# Patient Record
Sex: Female | Born: 1990
Health system: Southern US, Community
[De-identification: ages and names within clinical notes are randomized; demographics above are authoritative.]

## PROBLEM LIST (undated history)

## (undated) DIAGNOSIS — F32A Depression, unspecified: Secondary | ICD-10-CM

## (undated) DIAGNOSIS — E119 Type 2 diabetes mellitus without complications: Secondary | ICD-10-CM

## (undated) DIAGNOSIS — F329 Major depressive disorder, single episode, unspecified: Secondary | ICD-10-CM

## (undated) HISTORY — DX: Depression, unspecified: F32.A

## (undated) HISTORY — DX: Major depressive disorder, single episode, unspecified: F32.9

---

## 2011-03-08 ENCOUNTER — Emergency Department (HOSPITAL_COMMUNITY)
Admission: EM | Admit: 2011-03-08 | Discharge: 2011-03-08 | Disposition: A | Discharge status: Home / Self Care | Payer: BC Managed Care – PPO | Attending: Emergency Medicine | Admitting: Emergency Medicine

## 2011-03-08 DIAGNOSIS — R58 Hemorrhage, not elsewhere classified: Secondary | ICD-10-CM | POA: Insufficient documentation

## 2012-06-18 ENCOUNTER — Encounter (HOSPITAL_COMMUNITY): Payer: Self-pay | Admitting: *Deleted

## 2012-06-18 ENCOUNTER — Emergency Department (HOSPITAL_COMMUNITY)
Admission: EM | Admit: 2012-06-18 | Discharge: 2012-06-18 | Disposition: A | Discharge status: Home / Self Care | Payer: BC Managed Care – PPO | Source: Home / Self Care

## 2012-06-18 DIAGNOSIS — S63509A Unspecified sprain of unspecified wrist, initial encounter: Secondary | ICD-10-CM

## 2012-06-18 DIAGNOSIS — S66919A Strain of unspecified muscle, fascia and tendon at wrist and hand level, unspecified hand, initial encounter: Secondary | ICD-10-CM

## 2012-06-18 MED ORDER — NAPROXEN 500 MG PO TABS: 500.0000 mg | ORAL_TABLET | Freq: Two times a day (BID) | ORAL | Status: AC

## 2012-06-18 NOTE — ED Provider Notes (Signed)
History     CSN: [id number]  Arrival date & time 06/18/12  1830   None     Chief Complaint  Patient presents with  . Wrist Pain    (Consider location/radiation/quality/duration/timing/severity/associated sxs/prior treatment) HPI Comments: Pt first noticed pain in R wrist about 6 months ago, was very mild, didn't last long, spontaneously resolved.  About 6 weeks ago, began having pain more on and off, worse when at work lifting and opening boxes, but pain was still mild and never lasted very long.  Last night after work pain was much more severe than usual, lasted through the night.  At work today was the worst it's been.  Tonight after work, after resting, it is starting to feel better.  Denies injury.    Patient is a 21 y.o. female presenting with wrist pain. The history is provided by the patient.  Wrist Pain This is a new problem. The current episode started yesterday. The problem occurs constantly. The problem has been gradually improving. The symptoms are aggravated by bending and exertion. The symptoms are relieved by rest. She has tried nothing for the symptoms.    History reviewed. No pertinent past medical history.  History reviewed. No pertinent past surgical history.  History reviewed. No pertinent family history.  History  Substance Use Topics  . Smoking status: Former Games developer  . Smokeless tobacco: Not on file  . Alcohol Use: Yes    OB History    Grav Para Term Preterm Abortions TAB SAB Ect Mult Living                  Review of Systems  Constitutional: Negative for fever and chills.  Musculoskeletal:       R wrist pain.   Skin: Negative for color change, rash and wound.  Neurological: Negative for weakness and numbness.    Allergies  Review of patient's allergies indicates no known allergies.  Home Medications   Current Outpatient Rx  Name Route Sig Dispense Refill  . NAPROXEN 500 MG PO TABS Oral Take 1 tablet (500 mg total) by mouth 2 (two) times  daily. 20 tablet 0    BP 123/77  Pulse 73  Temp 98.2 F (36.8 C) (Oral)  Resp 17  SpO2 98%  LMP 06/12/2012  Physical Exam  Constitutional: She is oriented to person, place, and time. She appears well-developed and well-nourished. No distress.  Pulmonary/Chest: Effort normal.  Musculoskeletal:       Right wrist: She exhibits tenderness. She exhibits normal range of motion, no bony tenderness, no swelling and no deformity.       Left wrist: Normal.       Right hand: Normal.       Left hand: Normal.  Neurological: She is alert and oriented to person, place, and time. She has normal strength. No sensory deficit.       Strength and sensation in B wrist/hand normal    ED Course  Procedures (including critical care time)  Labs Reviewed - No data to display No results found.   1. Wrist strain       MDM          Cathlyn Parsons, NP 06/18/12 2019

## 2012-06-18 NOTE — ED Provider Notes (Signed)
Medical screening examination/treatment/procedure(s) were performed by non-physician practitioner and as supervising physician I was immediately available for consultation/collaboration.  Leslee Home, M.D.   Reuben Likes, MD 06/18/12 [phone]

## 2012-06-18 NOTE — ED Notes (Signed)
Pt with intermittent pain right wrist x 6 weeks - this episode onset last night - no known injury - worse with movement when making a fist

## 2012-06-18 NOTE — Discharge Instructions (Signed)
Use the wrist splint for support, especially when at work.  Take the pain medicine twice a day for a week, then as needed for pain.  Ice your wrist after working or when it's very painful.  If your wrist is not getting better after 1-2 weeks of this treatment, make an appointment to see Dr. Izora Ribas (the hand/wrist specialist).    Sprain A sprain happens when the bands of tissue that connect bones and hold joints together (ligaments) stretch too much or tear. HOME CARE  Raise (elevate) the injured area to lessen puffiness (swelling).   Put ice on the injured area.   Put ice in a plastic bag.   Place a towel between your skin and the bag.   Leave the ice on for 15 to 20 minutes, 3 to 4 times a day.   Do this for the first 24 hours or as told by your doctor.   Wear any splints, braces, castings, or elastic wraps as told by your child's doctor.   Eat healthy foods.   Only take medicine as told by your doctor.  GET HELP RIGHT AWAY IF:   There is numbness or tingling in the injured limb.   The toes or fingers become blue or white in the injured limb.   The sprained limb is cold to the touch.   There is a sharp, shooting pain in the injured limb.   The puffiness is getting worse instead of better.  MAKE SURE YOU:   Understand these instructions.   Will watch this condition.   Will get help right away if you are not doing well or get worse.  Document Released: 05/24/2008 Document Revised: 11/25/2011 Document Reviewed: 10/22/2009 [facility] Patient Information 2012 [place], [place].

## 2012-10-13 ENCOUNTER — Emergency Department (HOSPITAL_COMMUNITY)
Admission: EM | Admit: 2012-10-13 | Discharge: 2012-10-13 | Disposition: A | Discharge status: Home / Self Care | Payer: BC Managed Care – PPO | Source: Home / Self Care | Attending: Emergency Medicine | Admitting: Emergency Medicine

## 2012-10-13 ENCOUNTER — Encounter (HOSPITAL_COMMUNITY): Payer: Self-pay | Admitting: Emergency Medicine

## 2012-10-13 DIAGNOSIS — IMO0002 Reserved for concepts with insufficient information to code with codable children: Secondary | ICD-10-CM

## 2012-10-13 DIAGNOSIS — J069 Acute upper respiratory infection, unspecified: Secondary | ICD-10-CM

## 2012-10-13 MED ORDER — TRAMADOL HCL 50 MG PO TABS: 100.0000 mg | ORAL_TABLET | Freq: Three times a day (TID) | ORAL | Status: DC | PRN

## 2012-10-13 MED ORDER — CEPHALEXIN 500 MG PO CAPS: 500.0000 mg | ORAL_CAPSULE | Freq: Three times a day (TID) | ORAL | Status: DC

## 2012-10-13 NOTE — ED Provider Notes (Signed)
Chief Complaint  Patient presents with  . Hand Pain    History of Present Illness:   The patient is a 21 year old female who presents today with a two-day history of right middle finger pain and also a one-week history of bilateral ear pain, sore throat, and nasal congestion.  About a week ago the patient broke the nail of her right middle finger. It's been a little bit painful since then but over the past 2-3 days she's had some pain and swelling over the radial nail fold of the right middle finger. There is no visible collection of pus either in the nail fold or underneath the nail, but it is tender to touch. She is able to move all digits well and denies any numbness or tingling.  Also for the past week he's had nasal congestion, clear rhinorrhea, and sore throat. She has had pain in both of her ears and itching. There's been no drainage. She denies fever, chills, or cough.  Review of Systems:  Other than noted above, the patient denies any of the following symptoms: Systemic:  No fevers, chills, sweats, or aches.  No fatigue or tiredness. Musculoskeletal:  No joint pain, arthritis, bursitis, swelling, back pain, or neck pain. Neurological:  No muscular weakness, paresthesias, headache, or trouble with speech or coordination.  No dizziness.  PMFSH:  Past medical history, family history, social history, meds, and allergies were reviewed.  Physical Exam:   Vital signs:  BP 114/73  Pulse 73  Temp 98.1 F (36.7 C) (Oral)  SpO2 100%  LMP 09/21/2012 Gen:  Alert and oriented times 3.  In no distress. ENT: TMs and canals were normal except for a little bit of cerumen in the left ear canal. Nasal mucosa was mildly congested. The pharynx was clear. There was no cervical adenopathy. Musculoskeletal: There is tenderness to palpation over the radial nail fold the right middle finger. There is no visible swelling, erythema, or collection of pus in the nail fold underneath the nail. Otherwise, all  joints had a full a ROM with no swelling, bruising or deformity.  No edema, pulses full. Extremities were warm and pink.  Capillary refill was brisk.  Skin:  Clear, warm and dry.  No rash. Neuro:  Alert and oriented times 3.  Muscle strength was normal.  Sensation was intact to light touch.   Radiology:  No results found. I reviewed the images independently and personally and concur with the radiologist's findings.  Assessment:  The primary encounter diagnosis was Paronychia. A diagnosis of Viral upper respiratory infection was also pertinent to this visit.  Plan:   1.  The following meds were prescribed:   New Prescriptions   CEPHALEXIN (KEFLEX) 500 MG CAPSULE    Take 1 capsule (500 mg total) by mouth 3 (three) times daily.   TRAMADOL (ULTRAM) 50 MG TABLET    Take 2 tablets (100 mg total) by mouth every 8 (eight) hours as needed for pain.   2.  The patient was instructed in symptomatic care, including rest and activity, elevation, application of heat.  Appropriate handouts were given. 3.  The patient was told to return if becoming worse in any way, if no better in 2 or 3 days, and given some red flag symptoms that would indicate earlier return.   4.  The patient was told to follow up in 2 or 3 days if no better.   Reuben Likes, MD 10/13/12 [phone]

## 2012-10-13 NOTE — ED Notes (Signed)
Pt c/o right middle finger pain... Says she had chipped her nail about 2 weeks ago and believes it maybe an ingrown nail... Sx include: tender to touch, swelling, nausea, diarrhea... Denies: fever, vomiting... Also c/o bilateral ear pain.... Pt is alert w/no signs of distress.

## 2014-07-28 ENCOUNTER — Emergency Department (HOSPITAL_COMMUNITY)
Admission: EM | Admit: 2014-07-28 | Discharge: 2014-07-29 | Disposition: A | Discharge status: Home / Self Care | Payer: BC Managed Care – PPO | Attending: Emergency Medicine | Admitting: Emergency Medicine

## 2014-07-28 ENCOUNTER — Encounter (HOSPITAL_COMMUNITY): Payer: Self-pay | Admitting: Emergency Medicine

## 2014-07-28 DIAGNOSIS — B353 Tinea pedis: Secondary | ICD-10-CM

## 2014-07-28 DIAGNOSIS — L988 Other specified disorders of the skin and subcutaneous tissue: Secondary | ICD-10-CM | POA: Insufficient documentation

## 2014-07-28 DIAGNOSIS — Z792 Long term (current) use of antibiotics: Secondary | ICD-10-CM | POA: Insufficient documentation

## 2014-07-28 DIAGNOSIS — Z87891 Personal history of nicotine dependence: Secondary | ICD-10-CM | POA: Insufficient documentation

## 2014-07-28 NOTE — ED Notes (Signed)
Pt. reports itchy/painful blisters between left toes onset 3 days ago . Ambulatory .

## 2014-07-29 ENCOUNTER — Encounter (HOSPITAL_BASED_OUTPATIENT_CLINIC_OR_DEPARTMENT_OTHER): Payer: Self-pay | Admitting: Emergency Medicine

## 2014-07-29 ENCOUNTER — Emergency Department (HOSPITAL_BASED_OUTPATIENT_CLINIC_OR_DEPARTMENT_OTHER)
Admission: EM | Admit: 2014-07-29 | Discharge: 2014-07-29 | Disposition: A | Discharge status: Home / Self Care | Payer: BC Managed Care – PPO | Attending: Emergency Medicine | Admitting: Emergency Medicine

## 2014-07-29 DIAGNOSIS — R229 Localized swelling, mass and lump, unspecified: Secondary | ICD-10-CM | POA: Insufficient documentation

## 2014-07-29 DIAGNOSIS — R21 Rash and other nonspecific skin eruption: Secondary | ICD-10-CM

## 2014-07-29 DIAGNOSIS — Z87891 Personal history of nicotine dependence: Secondary | ICD-10-CM | POA: Insufficient documentation

## 2014-07-29 DIAGNOSIS — B353 Tinea pedis: Secondary | ICD-10-CM | POA: Insufficient documentation

## 2014-07-29 MED ORDER — IBUPROFEN 600 MG PO TABS: 600.0000 mg | ORAL_TABLET | Freq: Four times a day (QID) | ORAL | Status: DC | PRN

## 2014-07-29 MED ORDER — CEPHALEXIN 500 MG PO CAPS: 500.0000 mg | ORAL_CAPSULE | Freq: Four times a day (QID) | ORAL | Status: DC

## 2014-07-29 MED ORDER — CLOTRIMAZOLE 1 % EX CREA: TOPICAL_CREAM | CUTANEOUS | Status: DC

## 2014-07-29 NOTE — Discharge Instructions (Signed)

## 2014-07-29 NOTE — Discharge Instructions (Signed)
Please call your doctor for a followup appointment within 24-48 hours. When you talk to your doctor please let them know that you were seen in the emergency department and have them acquire all of your records so that they can discuss the findings with you and formulate a treatment plan to fully care for your new and ongoing problems. Please call and set-up an appointment with Dermatologist and Foot physician Please rest and stay hydrated  Please take medications as prescribed and on a full stomach  Please continue to monitor symptoms closely and if symptoms are to worsen or change (fever greater than 101, chills, sweating, nausea, vomiting, chest pain, shortness of breath, difficulty breathing, red streaks, swelling, numbness, tingling, drainage from the lesions, bleeding, pus drainage, injury) please report back to the ED immediately   Athlete's Foot Athlete's foot (tinea pedis) is a fungal infection of the skin on the feet. It often occurs on the skin between the toes or underneath the toes. It can also occur on the soles of the feet. Athlete's foot is more likely to occur in hot, humid weather. Not washing your feet or changing your socks often enough can contribute to athlete's foot. The infection can spread from person to person (contagious). CAUSES Athlete's foot is caused by a fungus. This fungus thrives in warm, moist places. Most people get athlete's foot by sharing shower stalls, towels, and wet floors with an infected person. People with weakened immune systems, including those with diabetes, may be more likely to get athlete's foot. SYMPTOMS   Itchy areas between the toes or on the soles of the feet.  White, flaky, or scaly areas between the toes or on the soles of the feet.  Tiny, intensely itchy blisters between the toes or on the soles of the feet.  Tiny cuts on the skin. These cuts can develop a bacterial infection.  Thick or discolored toenails. DIAGNOSIS  Your caregiver can  usually tell what the problem is by doing a physical exam. Your caregiver may also take a skin sample from the rash area. The skin sample may be examined under a microscope, or it may be tested to see if fungus will grow in the sample. A sample may also be taken from your toenail for testing. TREATMENT  Over-the-counter and prescription medicines can be used to kill the fungus. These medicines are available as powders or creams. Your caregiver can suggest medicines for you. Fungal infections respond slowly to treatment. You may need to continue using your medicine for several weeks. PREVENTION   Do not share towels.  Wear sandals in wet areas, such as shared locker rooms and shared showers.  Keep your feet dry. Wear shoes that allow air to circulate. Wear cotton or wool socks. HOME CARE INSTRUCTIONS   Take medicines as directed by your caregiver. Do not use steroid creams on athlete's foot.  Keep your feet clean and cool. Wash your feet daily and dry them thoroughly, especially between your toes.  Change your socks every day. Wear cotton or wool socks. In hot climates, you may need to change your socks 2 to 3 times per day.  Wear sandals or canvas tennis shoes with good air circulation.  If you have blisters, soak your feet in Burow's solution or Epsom salts for 20 to 30 minutes, 2 times a day to dry out the blisters. Make sure you dry your feet thoroughly afterward. SEEK MEDICAL CARE IF:   You have a fever.  You have swelling, soreness,  warmth, or redness in your foot.  You are not getting better after 7 days of treatment.  You are not completely cured after 30 days.  You have any problems caused by your medicines. MAKE SURE YOU:   Understand these instructions.  Will watch your condition.  Will get help right away if you are not doing well or get worse. Document Released: 12/03/2000 Document Revised: 02/28/2012 Document Reviewed: 09/24/2011 [facility] Patient Information  2015 [place], [place]. This information is not intended to replace advice given to you by your health care provider. Make sure you discuss any questions you have with your health care provider.  Cellulitis Cellulitis is an infection of the skin and the tissue beneath it. The infected area is usually red and tender. Cellulitis occurs most often in the arms and lower legs.  CAUSES  Cellulitis is caused by bacteria that enter the skin through cracks or cuts in the skin. The most common types of bacteria that cause cellulitis are staphylococci and streptococci. SIGNS AND SYMPTOMS   Redness and warmth.  Swelling.  Tenderness or pain.  Fever. DIAGNOSIS  Your health care provider can usually determine what is wrong based on a physical exam. Blood tests may also be done. TREATMENT  Treatment usually involves taking an antibiotic medicine. HOME CARE INSTRUCTIONS   Take your antibiotic medicine as directed by your health care provider. Finish the antibiotic even if you start to feel better.  Keep the infected arm or leg elevated to reduce swelling.  Apply a warm cloth to the affected area up to 4 times per day to relieve pain.  Take medicines only as directed by your health care provider.  Keep all follow-up visits as directed by your health care provider. SEEK MEDICAL CARE IF:   You notice red streaks coming from the infected area.  Your red area gets larger or turns dark in color.  Your bone or joint underneath the infected area becomes painful after the skin has healed.  Your infection returns in the same area or another area.  You notice a swollen bump in the infected area.  You develop new symptoms.  You have a fever. SEEK IMMEDIATE MEDICAL CARE IF:   You feel very sleepy.  You develop vomiting or diarrhea.  You have a general ill feeling (malaise) with muscle aches and pains. MAKE SURE YOU:   Understand these instructions.  Will watch your condition.  Will get help  right away if you are not doing well or get worse. Document Released: 09/15/2005 Document Revised: 04/22/2014 Document Reviewed: 02/21/2012 [facility] Patient Information 2015 [place], [place]. This information is not intended to replace advice given to you by your health care provider. Make sure you discuss any questions you have with your health care provider.   Emergency Department Resource Guide 1) Find a Doctor and Pay Out of Pocket Although you won't have to find out who is covered by your insurance plan, it is a good idea to ask around and get recommendations. You will then need to call the office and see if the doctor you have chosen will accept you as a new patient and what types of options they offer for patients who are self-pay. Some doctors offer discounts or will set up payment plans for their patients who do not have insurance, but you will need to ask so you aren't surprised when you get to your appointment.  2) Contact Your Local Health Department Not all health departments have doctors that can see patients  for sick visits, but many do, so it is worth a call to see if yours does. If you don't know where your local health department is, you can check in your phone book. The CDC also has a tool to help you locate your state's health department, and many state websites also have listings of all of their local health departments.  3) Find a Walk-in Clinic If your illness is not likely to be very severe or complicated, you may want to try a walk in clinic. These are popping up all over the country in pharmacies, drugstores, and shopping centers. They're usually staffed by nurse practitioners or physician assistants that have been trained to treat common illnesses and complaints. They're usually fairly quick and inexpensive. However, if you have serious medical issues or chronic medical problems, these are probably not your best option.  No Primary Care Doctor: - Call Health Connect at   [phone] - they can help you locate a primary care doctor that  accepts your insurance, provides certain services, etc. - Physician Referral Service- [phone]  Chronic Pain Problems: Organization         Address  Phone   Notes  [facility]  [phone] Patients need to be referred by their primary care doctor.   Medication Assistance: Organization         Address  Phone   Notes  [facility] Medication [facility] [address] [place], [place] [address] [phone] --Must be a resident of [facility] -- Must have NO insurance coverage whatsoever (no Medicaid/ Medicare, etc.) -- The pt. MUST have a primary care doctor that directs their care regularly and follows them in the community   [facility]ist  [phone]   [employer]  [phone]    Agencies that provide inexpensive medical care: Organization         Address  Phone   Notes  [facility]  [phone]   [facility]    [phone]   [facility] [address] [place], [place] [address] [phone]   [facility] [address] [place] [place] [phone]   Planned Parenthood    [phone]   [facility]    [phone]   Community [facility] and [facility]  [address], [place] Phone:  [phone], Fax:  [phone] Hours of Operation:  9 am - 6 pm, M-F.  Also accepts Medicaid/Medicare and self-pay.  [facility]  [address], [place] Phone: [phone], Fax: [phone]. Hours of Operation:  8:30 am - 5:30 pm, M-F.  Also accepts Medicaid and self-pay.  [facility] [place] [address] [place] Phone: [phone]   [facility] [address] Natasha Bence [place], [place] [phone], Ext. [phone] Mondays & Thursdays: 7-9 AM.  First 15 patients are seen on a first come, first serve basis.     Medicaid-accepting [facility] Providers:  Organization         Address  Phone   Notes  [facility] [address], [place] [phone] Also accepts self-pay patients.  [facility] [address] Laurell Josephs [place], [place]  [phone]   [facility] [address], [place] [phone]   [facility] [address], [place] [phone])  [phone]   Renaye Rakers [address], [place]   [phone] Only accepts [place] patients after they have their name applied to their card.   Self-Pay (no insurance) in [facility]:  Organization         Address  Phone   Notes  Sickle Cell Patients, [facility] [address] [place], [place] [phone]   [facility] [address] [place], [place] [phone]   [facility] [place]  [address], [place] [phone]   [facility]/Dr. Osei-Bonsu  [address], [place] or [address], [place] [phone] Phone number for both [place] and [place] locations is the same.  [facility]gent Medical and [facility] [address], [place] [phone]   [facility] [address] [place] or [address] [phone]   [facility] [address], [place] [phone], phone; [phone], fax Sees patients 1st and 3rd Saturday of every month.  Must not qualify for public or private insurance (i.e. Medicaid, Medicare, [place] Health Choice, Veterans' Benefits)  Household income should be no more than 200% of the poverty level The clinic cannot treat you if you are pregnant or think you are pregnant  Sexually transmitted diseases are not treated at the clinic.    Dental Care: Organization         Address  Phone  Notes  [facility]  Department of [facility] [address] [place], [place] [phone] Accepts children up to age 70 who are enrolled in [place] or [place] Health Choice; pregnant women with a Medicaid card; and children who have applied for Medicaid or [place] Health Choice, but were declined, whose parents can pay a reduced fee at time of service.  [facility] Department of [facility]  [address], [place] [phone] Accepts children up to age 13 who are enrolled in [place] or [place] Health Choice; pregnant women with a Medicaid card; and children who have applied for Medicaid or [place] Health Choice, but were declined, whose parents can pay a reduced fee at time of service.  [facility]  [address] [place], [place] [phone] Patients are seen by appointment only. Walk-ins are not accepted. [facility] will see patients 57 years of age and older. Monday - Tuesday (8am-5pm) Most Wednesdays (8:30-5pm) $30 per visit, cash only  [facility] Adult Dental Access [facility]  [address], [facility] [phone] Patients are seen by appointment only. Walk-ins are not accepted. [facility] will see patients 20 years of age and older. One Wednesday Evening (Monthly: Volunteer Based).  $30 per visit, cash only  [employer]  [phone] for adults; Children under age 7, call Graduate Pediatric Dentistry at [phone]. Children aged 85-14, please call [phone] to request a pediatric application.  Dental services are provided in all areas of dental care including fillings, crowns and bridges, complete and partial dentures, implants, gum treatment, root canals, and extractions. Preventive care is also provided. Treatment is provided to both adults and children. Patients are selected via a lottery and there is often a waiting list.   [facility] [address] [place]  [phone]  [email or web address]   [facility] [address], [place],  [place] [phone], Ext. [phone] Second and Fourth Thursday of each month, opens at 6:30 AM; Clinic ends at 9 AM.  Patients are seen on a first-come first-served basis, and a limited number are seen during each clinic.   [facility]  [address] Ether [place], [place] [phone]   Eligibility Requirements You must have lived in [place], [place], or [place] counties for at least the last three months.   You cannot be eligible for state or federal sponsored [place], including [employer], [place], or [employer].   You generally cannot be eligible for healthcare insurance through your employer.    How to apply: Eligibility screenings are held every Tuesday and Wednesday afternoon from 1:00 pm until 4:00 pm. You do not need an appointment for the interview!  [facility] [address], [place], [place] [phone]   [facility] Health Department  [phone]   [facility] Health Department  [phone]   [facility] Health Department  [phone]    Behavioral Health Resources in the Community: Intensive Outpatient Programs Organization         Address  Phone  Notes  [facility] Services [address], [place], [place] [phone]   [facility] [address], [place], [place] [phone]   [facility] [address], [place], [place]  [phone]   [facility] Mental Health [address]  [place], [place] [phone] or [phone]   Substance Abuse Resources Organization         Address  Phone  Notes  [facility] Drug Services  [phone]   [facility]  [phone]   [facility] [place]  [phone]   Floydene Flock  [phone]   Residential & Outpatient Substance Abuse Program  [phone]   Psychological Services Organization          Address  Phone  Notes  [facility] Behavioral Health  [phone]   [facility]  [phone]   [facility] Mental Health [address], [place] [phone] or [phone]    Mobile Crisis Teams Organization         Address  Phone  Notes  [facility], Mobile Crisis Care Unit  [phone]   [facility]  [address] [place], [place] [phone]   Doristine Locks [address] [place] [phone]    Self-Help/Support Groups Organization         Address  Phone             Notes  [facility] [place] - variety of support groups  336- I7437963 Call for more information  Narcotics Anonymous (NA), [facility] [address], [employer] [place]  2 meetings at this location   Statistician         Address  Phone  Notes  [facility] [address],    [place]  [phone]   [facility]  [address], [place] [address], [place], [place] [phone]   [facility] [address] [place], [place] [phone] Admissions: 8am-3pm M-F  [facility] [address]    [place], [place] [phone]   [facility] [address] Starling Manns [place], [place] [phone]   [facility] [address],  [place], [place] [phone]   [facility] - Intensive Outpatient [address] Laurell Joseph[address], [place], [place] [phone]   [facility] ([facility]iction  [facility] [address]  [place], [place] [phone] or [phone]   [facility] ([facility]) [address] [place], [place] [phone] Accepts Medicaid  [facility] [address]  [place] [phone] Substance Abuse/Addiction Treatment   [facility] Resources Organization         Address  Phone  Notes  [facility]  [phone]   Angie FavaJulie Brannon, PhD [address] [place], [place]   [phone]) [phone] or [phone]) [phone]   [facility]   [address] [place], [place] [phone](336) [phone]   [facility] [address], [place], [place] [phone](336) [phone] Insurance/Medicaid/sponsorship through [facility]  [facility] [address]                                    [place], [place] [phone](336) [phone] Therapy/tele-psych/case  [facility] [address]   [place], [place] [phone]) [phone]    Dr. Lolly MustacheArfeen  [phone](336) [phone]   [facility]  [employer] [facility] 1) [address],  2) [address], [place] 3)  [address], [place] [phone]) [phone]  [phone](336) [phone]   [facility] Child Abuse Hotline [phone]) [phone] or [phone]) [phone] (After Hours)

## 2014-07-29 NOTE — ED Provider Notes (Signed)
Medical screening examination/treatment/procedure(s) were performed by non-physician practitioner and as supervising physician I was immediately available for consultation/collaboration.   EKG Interpretation None        Lilyauna Miedema, MD 07/29/14 0620 

## 2014-07-29 NOTE — ED Provider Notes (Signed)
Medical screening examination/treatment/procedure(s) were conducted as a shared visit with non-physician practitioner(s) and myself.  I personally evaluated the patient during the encounter.  Left foot between 2/3rd toes c/w tinea was itchy and with mild redness/maceration no pain; left foot btn 1/2nd toes with two 1cm vessicles, tender, mild erythema, possible localized cellultis   [name], MD 08/06/14 1300

## 2014-07-29 NOTE — ED Provider Notes (Signed)
CSN: [id number]     Arrival date & time 07/28/14  2330 History   First MD Initiated Contact with Patient 07/28/14 2356     Chief Complaint  Patient presents with  . Blister     (Consider location/radiation/quality/duration/timing/severity/associated sxs/prior Treatment) HPI Comments: Patient presents emergency department with chief complaint of blisters on toes. She states the symptoms started about 3 days ago. She reports associated itching. She has not tried using anything to alleviate her symptoms. There are no aggravating or alleviating factors. She denies any exposures to athlete's foot. Denies history of same.  The history is provided by the patient. No language interpreter was used.    History reviewed. No pertinent past medical history. History reviewed. No pertinent past surgical history. No family history on file. History  Substance Use Topics  . Smoking status: Former Games developermoker  . Smokeless tobacco: Not on file  . Alcohol Use: Yes   OB History   Grav Para Term Preterm Abortions TAB SAB Ect Mult Living                 Review of Systems  Constitutional: Negative for fever and chills.  Respiratory: Negative for shortness of breath.   Cardiovascular: Negative for chest pain.  Gastrointestinal: Negative for nausea, vomiting, diarrhea and constipation.  Genitourinary: Negative for dysuria.  Skin: Positive for rash.      Allergies  Review of patient's allergies indicates no known allergies.  Home Medications   Prior to Admission medications   Medication Sig Start Date End Date Taking? Authorizing Provider  cephALEXin (KEFLEX) 500 MG capsule Take 1 capsule (500 mg total) by mouth 3 (three) times daily. 10/13/12   Reuben Likesavid C Keller, MD  traMADol (ULTRAM) 50 MG tablet Take 2 tablets (100 mg total) by mouth every 8 (eight) hours as needed for pain. 10/13/12   Reuben Likesavid C Keller, MD   BP 122/70  Pulse 97  Temp(Src) 98.3 F (36.8 C) (Oral)  Resp 18  SpO2 98% Physical Exam   Nursing note and vitals reviewed. Constitutional: She is oriented to person, place, and time. She appears well-developed and well-nourished.  HENT:  Head: Normocephalic and atraumatic.  Eyes: Conjunctivae and EOM are normal.  Neck: Normal range of motion.  Cardiovascular: Normal rate.   Pulmonary/Chest: Effort normal.  Abdominal: She exhibits no distension.  Musculoskeletal: Normal range of motion.  Neurological: She is alert and oriented to person, place, and time.  Skin: Skin is dry.  Tinea pedis of bilateral toes  Psychiatric: She has a normal mood and affect. Her behavior is normal. Judgment and thought content normal.    ED Course  Procedures (including critical care time) Labs Review Labs Reviewed - No data to display  Imaging Review No results found.   EKG Interpretation None      MDM   Final diagnoses:  Tinea pedis of both feet    Patient with athlete's foot. Will treat with Lotrimin. Discharged home.    Roxy Horsemanobert Kailly Richoux, PA-C 07/29/14 [phone]

## 2014-07-29 NOTE — ED Provider Notes (Signed)
CSN: [id number]     Arrival date & time 07/29/14  1318 History   First MD Initiated Contact with Patient 07/29/14 1402     No chief complaint on file.    (Consider location/radiation/quality/duration/timing/severity/associated sxs/prior Treatment) The history is provided by the patient. No language interpreter was used.  Alexandra Harris is a 23 y/o F with no known significant PMHx presenting to the ED with rash that has been ongoing since Thursday. Patient reported that on Thursday night she noticed small bumps to the toes of her feet. Stated that the first lesion she saw was on her left second digit. Reported that she noticed that the lesion appeared to be a blister and hurt upon palpation. Reported that on Friday she went skating and after skating she reported that the lesions were pruritic and painful. Stated that the pain got worse, especially when applying pressure to the feet. Reported that the pain to be a sharp pain. Stated that she was seen in the ED last night regarding this issue and was diagnosed with Athlete's Foot and stated that she was given Lotrimin. Patient reported that the cream has alleviated the pruritis, but continues to report pain. Stated that she was at a friend's house and was bit by bed bug-reported that a couple days later this is when she noticed the rash to her feet. Denied being at the beach or walking barefoot recently. Denied changes to skin colored, fever, chills, loss of sensation, new sneakers/lash detergents/soaps, throat closing sensation, chest pain, shortness of breath, difficulty breathing. PCP none  History reviewed. No pertinent past medical history. History reviewed. No pertinent past surgical history. History reviewed. No pertinent family history. History  Substance Use Topics  . Smoking status: Former Research scientist (life sciences)  . Smokeless tobacco: Not on file  . Alcohol Use: Yes   OB History   Grav Para Term Preterm Abortions TAB SAB Ect Mult Living                  Review of Systems  Constitutional: Negative for fever and chills.  Skin: Positive for rash.  Neurological: Negative for weakness and numbness.      Allergies  Review of patient's allergies indicates no known allergies.  Home Medications   Prior to Admission medications   Medication Sig Start Date End Date Taking? Authorizing Provider  cephALEXin (KEFLEX) 500 MG capsule Take 1 capsule (500 mg total) by mouth 3 (three) times daily. 10/13/12   Harden Mo, MD  cephALEXin (KEFLEX) 500 MG capsule Take 1 capsule (500 mg total) by mouth 4 (four) times daily. 07/29/14   Kaniah Rizzolo, PA-C  clotrimazole (LOTRIMIN) 1 % cream Apply between clean and dry toes in the morning and night for 14 days. 07/29/14   Montine Circle, PA-C  ibuprofen (ADVIL,MOTRIN) 600 MG tablet Take 1 tablet (600 mg total) by mouth every 6 (six) hours as needed. 07/29/14   Luellen Howson, PA-C  traMADol (ULTRAM) 50 MG tablet Take 2 tablets (100 mg total) by mouth every 8 (eight) hours as needed for pain. 10/13/12   Harden Mo, MD   BP 133/86  Pulse 88  Temp(Src) 98.7 F (37.1 C) (Oral)  Resp 16  Ht 5' (1.524 m)  Wt 180 lb (81.647 kg)  BMI 35.15 kg/m2  SpO2 100% Physical Exam  Nursing note and vitals reviewed. Constitutional: She is oriented to person, place, and time. She appears well-developed and well-nourished. No distress.  HENT:  Head: Normocephalic and atraumatic.  Mouth/Throat: Oropharynx is  clear and moist. No oropharyngeal exudate.  Negative lesions or sores identified to the oral mucosa  Eyes: Conjunctivae and EOM are normal. Pupils are equal, round, and reactive to light. Right eye exhibits no discharge. Left eye exhibits no discharge.  Neck: Normal range of motion. Neck supple. No tracheal deviation present.  Negative neck stiffness Negative rigidity Negative cervical lymphadenopathy  Cardiovascular: Normal rate, regular rhythm and normal heart sounds.  Exam reveals no friction rub.   No  murmur heard. Pulmonary/Chest: Effort normal and breath sounds normal. No respiratory distress. She has no wheezes. She has no rales.  Musculoskeletal: Normal range of motion.  Full ROM to upper and lower extremities without difficulty noted, negative ataxia noted.  Lymphadenopathy:    She has no cervical adenopathy.  Neurological: She is alert and oriented to person, place, and time. No cranial nerve deficit. She exhibits normal muscle tone. Coordination normal.  Skin: Skin is warm and dry. Rash noted. She is not diaphoretic. No erythema.  Calcified skin with clear fluid collection identified to the digits of the left and right foot. Discomfort upon palpation. Negative findings of ulcerations. Negative active drainage or bleeding. Negative erythema. Negative erythematous base. Negative macerated tissue. Negative flaking of skin. Negative red streaks.   Negative lesions identified to the palms of the hands or soles of the feet.  Psychiatric: She has a normal mood and affect. Her behavior is normal. Thought content normal.    ED Course  Procedures (including critical care time)  4:01 PM Patient seen and assessed by attending physician, Dr. Rebeca Alert. As per physician, reported that there could be possible beginnings of a cellulitis. Recommended patient to continue Lotrimin for athlete's foot and to start patient on a Keflex.   Labs Review Labs Reviewed - No data to display  Imaging Review No results found.   EKG Interpretation None      MDM   Final diagnoses:  Tinea pedis of both feet  Rash    Filed Vitals:   07/29/14 1323  BP: 133/86  Pulse: 88  Temp: 98.7 F (37.1 C)  TempSrc: Oral  Resp: 16  Height: 5' (1.524 m)  Weight: 180 lb (81.647 kg)  SpO2: 100%    Doubt erythema multiforme major and minor. Doubt shingles. Doubt syphilis. Doubt allergic reaction/urticaria rash. Suspicion to be Tinea pedis with beginnings of cellulitis. Patient seen and assessed by attending  physician who recommended patient to continue Lotrimin cream, recommended patient be started on Keflex. Patient stable, afebrile. Patient not septic appearing. Pulses palpable and strong. Full range of motion noted. Negative neurological deficits. Discharged patient. Discharged patient with antibiotics. Discussed with patient to rest and stay hydrated. Referred to Urgent Care Center, dermatologist, and podiatrist. Discussed with patient to closely monitor symptoms and if symptoms are to worsen or change to report back to the ED - strict return instructions given.  Patient agreed to plan of care, understood, all questions answered.   Jamse Mead, PA-C 07/29/14 [id number]

## 2014-07-29 NOTE — ED Notes (Signed)
Pt c/o bil feet rash x 4 days, seen [facility] last night for same

## 2014-07-31 ENCOUNTER — Emergency Department (INDEPENDENT_AMBULATORY_CARE_PROVIDER_SITE_OTHER)
Admission: EM | Admit: 2014-07-31 | Discharge: 2014-07-31 | Disposition: A | Discharge status: Home / Self Care | Payer: Self-pay | Source: Home / Self Care | Attending: Family Medicine | Admitting: Family Medicine

## 2014-07-31 ENCOUNTER — Encounter (HOSPITAL_COMMUNITY): Payer: Self-pay | Admitting: Emergency Medicine

## 2014-07-31 DIAGNOSIS — B353 Tinea pedis: Secondary | ICD-10-CM

## 2014-07-31 MED ORDER — TERBINAFINE HCL 1 % EX CREA: 1.0000 "application " | TOPICAL_CREAM | Freq: Two times a day (BID) | CUTANEOUS | Status: DC

## 2014-07-31 MED ORDER — TERBINAFINE HCL 250 MG PO TABS: 250.0000 mg | ORAL_TABLET | Freq: Every day | ORAL | Status: DC

## 2014-07-31 NOTE — ED Provider Notes (Signed)
CSN: [id number]     Arrival date & time 07/31/14  1759 History   First MD Initiated Contact with Patient 07/31/14 1828     Chief Complaint  Patient presents with  . Foot Pain   (Consider location/radiation/quality/duration/timing/severity/associated sxs/prior Treatment) Patient is a 23 y.o. female presenting with lower extremity pain. The history is provided by the patient.  Foot Pain This is a new problem. The current episode started more than 1 week ago. The problem has been gradually worsening (seen at [facility] on8/9 and [facility] and here today. states blistering rash spreading and more painful.).    History reviewed. No pertinent past medical history. History reviewed. No pertinent past surgical history. No family history on file. History  Substance Use Topics  . Smoking status: Former Games developermoker  . Smokeless tobacco: Not on file  . Alcohol Use: Yes   OB History   Grav Para Term Preterm Abortions TAB SAB Ect Mult Living                 Review of Systems  Constitutional: Negative.   Musculoskeletal: Positive for gait problem.  Skin: Positive for rash.    Allergies  Review of patient's allergies indicates no known allergies.  Home Medications   Prior to Admission medications   Medication Sig Start Date End Date Taking? Authorizing Provider  cephALEXin (KEFLEX) 500 MG capsule Take 1 capsule (500 mg total) by mouth 3 (three) times daily. 10/13/12  Yes Reuben Likesavid C Keller, MD  clotrimazole (LOTRIMIN) 1 % cream Apply between clean and dry toes in the morning and night for 14 days. 07/29/14  Yes Roxy Horsemanobert Browning, PA-C  cephALEXin (KEFLEX) 500 MG capsule Take 1 capsule (500 mg total) by mouth 4 (four) times daily. 07/29/14   Marissa Sciacca, PA-C  ibuprofen (ADVIL,MOTRIN) 600 MG tablet Take 1 tablet (600 mg total) by mouth every 6 (six) hours as needed. 07/29/14   Marissa Sciacca, PA-C  terbinafine (LAMISIL) 1 % cream Apply 1 application topically 2 (two) times daily. 07/31/14    Linna HoffJames D Frances Ambrosino, MD  terbinafine (LAMISIL) 250 MG tablet Take 1 tablet (250 mg total) by mouth daily. 07/31/14   Linna HoffJames D Nat Lowenthal, MD  traMADol (ULTRAM) 50 MG tablet Take 2 tablets (100 mg total) by mouth every 8 (eight) hours as needed for pain. 10/13/12   Reuben Likesavid C Keller, MD   LMP 07/03/2014 Physical Exam  Nursing note and vitals reviewed. Constitutional: She is oriented to person, place, and time. She appears well-developed and well-nourished. She appears distressed.  Neurological: She is alert and oriented to person, place, and time.  Skin: Skin is warm and dry. Rash noted.  Weeping blistering clear rash between toes on both feet, no erythema or signs of infection.    ED Course  Procedures (including critical care time) Labs Review Labs Reviewed - No data to display  Imaging Review No results found.   MDM   1. Tinea pedis of both feet        Linna HoffJames D Joury Allcorn, MD 07/31/14 [phone]

## 2014-07-31 NOTE — ED Notes (Signed)
Pt reports blister on left foot x 8 days that itch; now spreading to right foot [facility]; dx w/tinea pedis of bilateral foot  Given Keflex and Lotrimin w/no relief Slow steady gait Alert w/no signs of acute distress.

## 2014-10-24 ENCOUNTER — Emergency Department (HOSPITAL_COMMUNITY)
Admission: EM | Admit: 2014-10-24 | Discharge: 2014-10-24 | Disposition: A | Discharge status: Home / Self Care | Payer: Self-pay | Attending: Emergency Medicine | Admitting: Emergency Medicine

## 2014-10-24 ENCOUNTER — Encounter (HOSPITAL_COMMUNITY): Payer: Self-pay | Admitting: *Deleted

## 2014-10-24 DIAGNOSIS — Z87891 Personal history of nicotine dependence: Secondary | ICD-10-CM | POA: Insufficient documentation

## 2014-10-24 DIAGNOSIS — Z79899 Other long term (current) drug therapy: Secondary | ICD-10-CM | POA: Insufficient documentation

## 2014-10-24 DIAGNOSIS — Z792 Long term (current) use of antibiotics: Secondary | ICD-10-CM | POA: Insufficient documentation

## 2014-10-24 DIAGNOSIS — Z791 Long term (current) use of non-steroidal anti-inflammatories (NSAID): Secondary | ICD-10-CM | POA: Insufficient documentation

## 2014-10-24 DIAGNOSIS — K1370 Unspecified lesions of oral mucosa: Secondary | ICD-10-CM

## 2014-10-24 DIAGNOSIS — K1379 Other lesions of oral mucosa: Secondary | ICD-10-CM | POA: Insufficient documentation

## 2014-10-24 MED ORDER — MAGIC MOUTHWASH W/LIDOCAINE: 5.0000 mL | Freq: Three times a day (TID) | ORAL | Status: DC | PRN

## 2014-10-24 NOTE — ED Provider Notes (Signed)
CSN: [id number]     Arrival date & time 10/24/14  1457 History  This chart was scribed for non-physician practitioner, Santiago GladHeather Jenise Iannelli, PA-C, working with Toy CookeyMegan Docherty, MD, by Bronson CurbJacqueline Melvin, ED Scribe. This patient was seen in room TR10C/TR10C and the patient's care was started at 4:01 PM.   Chief Complaint  Patient presents with  . Oral Pain    The history is provided by the patient. No language interpreter was used.     HPI Comments: Alexandra Harris is a 23 y.o. female who presents to the Emergency Department complaining of oral pain to the left side of the "roof of the mouth" for the past 3 days. Patient has tried a liquid canker sore medication, but states this increased her pain and caused her mouth to burn. She also reports that eating makes the pain worse. She denies history of the same. Patient reports eating hot peppers the night before onset of symptoms. She denies fever, chills, sore throat, or difficulty swallowing.  She is a former smoker.  No prior history of oral cancer.   History reviewed. No pertinent past medical history. History reviewed. No pertinent past surgical history. History reviewed. No pertinent family history. History  Substance Use Topics  . Smoking status: Former Games developermoker  . Smokeless tobacco: Not on file  . Alcohol Use: Yes   OB History    No data available     Review of Systems  Constitutional: Negative for fever and chills.  HENT: Positive for mouth sores. Negative for dental problem, sore throat and trouble swallowing.       Allergies  Review of patient's allergies indicates no known allergies.  Home Medications   Prior to Admission medications   Medication Sig Start Date End Date Taking? Authorizing Provider  cephALEXin (KEFLEX) 500 MG capsule Take 1 capsule (500 mg total) by mouth 3 (three) times daily. 10/13/12   Reuben Likesavid C Keller, MD  cephALEXin (KEFLEX) 500 MG capsule Take 1 capsule (500 mg total) by mouth 4 (four) times daily. 07/29/14    Marissa Sciacca, PA-C  clotrimazole (LOTRIMIN) 1 % cream Apply between clean and dry toes in the morning and night for 14 days. 07/29/14   Roxy Horsemanobert Browning, PA-C  ibuprofen (ADVIL,MOTRIN) 600 MG tablet Take 1 tablet (600 mg total) by mouth every 6 (six) hours as needed. 07/29/14   Marissa Sciacca, PA-C  terbinafine (LAMISIL) 1 % cream Apply 1 application topically 2 (two) times daily. 07/31/14   Linna HoffJames D Kindl, MD  terbinafine (LAMISIL) 250 MG tablet Take 1 tablet (250 mg total) by mouth daily. 07/31/14   Linna HoffJames D Kindl, MD  traMADol (ULTRAM) 50 MG tablet Take 2 tablets (100 mg total) by mouth every 8 (eight) hours as needed for pain. 10/13/12   Reuben Likesavid C Keller, MD   Triage Vitals: BP 120/80 mmHg  Pulse 68  Temp(Src) 97.9 F (36.6 C) (Oral)  Resp 12  SpO2 99%  LMP 10/24/2014  Physical Exam  Constitutional: She is oriented to person, place, and time. She appears well-developed and well-nourished. No distress.  HENT:  Head: Normocephalic and atraumatic.  Mouth/Throat: Oropharynx is clear and moist.  Erythematous macular area of the left soft palette of the mouth.  No drainage.  No fluctuance.  No trismus.  No oral swelling.    Eyes: Conjunctivae and EOM are normal.  Neck: No tracheal deviation present.  Cardiovascular: Normal rate, regular rhythm and normal heart sounds.   Pulmonary/Chest: Effort normal and breath sounds normal. No respiratory distress.  Musculoskeletal: Normal range of motion.  Neurological: She is alert and oriented to person, place, and time.  Skin: Skin is warm and dry.  Psychiatric: She has a normal mood and affect. Her behavior is normal.  Nursing note and vitals reviewed.   ED Course  Procedures (including critical care time)  DIAGNOSTIC STUDIES: Oxygen Saturation is 99% on room air, normal by my interpretation.    COORDINATION OF CARE: At 1604 Discussed treatment plan with patient. Patient agrees.   Labs Review Labs Reviewed - No data to display  Imaging  Review No results found.   EKG Interpretation None      MDM   Final diagnoses:  None   Patient presenting with an erythematous oral lesion on the roof of her mouth.  No other symptoms.  No injury or trauma to the mouth.  Do not feel there is anything emergent at this time.  Patient given Rx for Magic Mouthwash.  Patient given referral to PCP and also dentist.  Return precautions given.     Santiago GladHeather Desiray Orchard, PA-C 10/26/14 1047  Toy CookeyMegan Docherty, MD 10/26/14 2119

## 2014-10-24 NOTE — Discharge Instructions (Signed)
It is important for you to follow up with a dentist.  You need to have this area rechecked.   Take mouthwash as prescribed.

## 2014-10-24 NOTE — ED Notes (Signed)
X 3 days of mouth pain; burns when eating; no bumps.  States, "roof of mouth on lt.side."

## 2016-01-08 ENCOUNTER — Encounter (HOSPITAL_COMMUNITY): Payer: Self-pay | Admitting: *Deleted

## 2016-01-08 ENCOUNTER — Emergency Department (HOSPITAL_COMMUNITY)
Admission: EM | Admit: 2016-01-08 | Discharge: 2016-01-08 | Disposition: A | Discharge status: Home / Self Care | Payer: Self-pay | Attending: Emergency Medicine | Admitting: Emergency Medicine

## 2016-01-08 DIAGNOSIS — Z792 Long term (current) use of antibiotics: Secondary | ICD-10-CM | POA: Insufficient documentation

## 2016-01-08 DIAGNOSIS — R110 Nausea: Secondary | ICD-10-CM | POA: Insufficient documentation

## 2016-01-08 DIAGNOSIS — N946 Dysmenorrhea, unspecified: Secondary | ICD-10-CM | POA: Insufficient documentation

## 2016-01-08 DIAGNOSIS — Z79899 Other long term (current) drug therapy: Secondary | ICD-10-CM | POA: Insufficient documentation

## 2016-01-08 DIAGNOSIS — Z87891 Personal history of nicotine dependence: Secondary | ICD-10-CM | POA: Insufficient documentation

## 2016-01-08 MED ORDER — KETOROLAC TROMETHAMINE 60 MG/2ML IM SOLN
60.0000 mg | Freq: Once | INTRAMUSCULAR | Status: AC
  Administered 2016-01-08: 60 mg via INTRAMUSCULAR
  Filled 2016-01-08: qty 2

## 2016-01-08 MED ORDER — ONDANSETRON 4 MG PO TBDP
4.0000 mg | ORAL_TABLET | Freq: Once | ORAL | Status: AC
  Administered 2016-01-08: 4 mg via ORAL
  Filled 2016-01-08: qty 1

## 2016-01-08 MED ORDER — IBUPROFEN 800 MG PO TABS: 800.0000 mg | ORAL_TABLET | Freq: Three times a day (TID) | ORAL | Status: DC

## 2016-01-08 NOTE — ED Provider Notes (Signed)
History  By signing my name below, I, Karle Plumber, attest that this documentation has been prepared under the direction and in the presence of Arshia Spellman, PA-C. Electronically Signed: Karle Plumber, ED Scribe. 01/08/2016. 7:25 PM.  Chief Complaint  Patient presents with  . Abdominal Cramping   The history is provided by the patient and medical records. No language interpreter was used.    HPI Comments:  Alexandra Harris is a 25 y.o. female who presents to the Emergency Department complaining of abdominal cramps while menstruating. She states she has had abdominal cramps during menstruation for a long time and states these feel like the normal cramps she always has. She states she has had these same symptoms for 10+ years. Pt reports associated nausea which is typical of her menstrual cycle. She reports her period started today and so did the cramps. She has been taking Midol for the pain today but reports taking Aleve in the past without relief. She denies modifying factors. She denies fever, chills, vomiting, diarrhea or any other complaints. She states "I've had these bad cramps for so long I figured I needed to get it checked out. I was hoping y'all could tell me why they are so bad". She does not have a PCP or insurance.   History reviewed. No pertinent past medical history. History reviewed. No pertinent past surgical history. No family history on file. Social History  Substance Use Topics  . Smoking status: Former Games developer  . Smokeless tobacco: None  . Alcohol Use: Yes   OB History    No data available     Review of Systems  Gastrointestinal: Positive for abdominal pain.  All other systems reviewed and are negative.   Allergies  Review of patient's allergies indicates no known allergies.  Home Medications   Prior to Admission medications   Medication Sig Start Date End Date Taking? Authorizing Provider  ibuprofen (ADVIL,MOTRIN) 600 MG tablet Take 1 tablet (600 mg  total) by mouth every 6 (six) hours as needed. 07/29/14  Yes Marissa Sciacca, PA-C  Alum & Mag Hydroxide-Simeth (MAGIC MOUTHWASH W/LIDOCAINE) SOLN Take 5 mLs by mouth 3 (three) times daily as needed for mouth pain. 10/24/14   Heather Laisure, PA-C  cephALEXin (KEFLEX) 500 MG capsule Take 1 capsule (500 mg total) by mouth 3 (three) times daily. 10/13/12   Reuben Likes, MD  cephALEXin (KEFLEX) 500 MG capsule Take 1 capsule (500 mg total) by mouth 4 (four) times daily. 07/29/14   Marissa Sciacca, PA-C  clotrimazole (LOTRIMIN) 1 % cream Apply between clean and dry toes in the morning and night for 14 days. 07/29/14   Roxy Horseman, PA-C  ibuprofen (ADVIL,MOTRIN) 800 MG tablet Take 1 tablet (800 mg total) by mouth 3 (three) times daily. 01/08/16   Ahad Colarusso, PA-C  terbinafine (LAMISIL) 1 % cream Apply 1 application topically 2 (two) times daily. 07/31/14   Linna Hoff, MD  terbinafine (LAMISIL) 250 MG tablet Take 1 tablet (250 mg total) by mouth daily. 07/31/14   Linna Hoff, MD  traMADol (ULTRAM) 50 MG tablet Take 2 tablets (100 mg total) by mouth every 8 (eight) hours as needed for pain. 10/13/12   Reuben Likes, MD   Triage Vitals: BP 142/82 mmHg  Pulse 71  Temp(Src) 98 F (36.7 C) (Oral)  Resp 18  SpO2 100%  LMP 01/08/2016 Physical Exam  Constitutional: She appears well-developed and well-nourished. No distress.  HENT:  Head: Normocephalic and atraumatic.  Right Ear: External ear  normal.  Left Ear: External ear normal.  Eyes: Conjunctivae are normal. Right eye exhibits no discharge. Left eye exhibits no discharge. No scleral icterus.  Neck: Normal range of motion.  Cardiovascular: Normal rate.   Pulmonary/Chest: Effort normal.  Abdominal: Soft. Bowel sounds are normal. She exhibits no distension. There is no tenderness. There is no rebound and no guarding.  Musculoskeletal: Normal range of motion.  Moves all extremities spontaneously  Neurological: She is alert. Coordination  normal.  Skin: Skin is warm and dry.  Psychiatric: She has a normal mood and affect. Her behavior is normal.  Nursing note and vitals reviewed.   ED Course  Procedures (including critical care time) DIAGNOSTIC STUDIES: Oxygen Saturation is 100% on RA, normal by my interpretation.   COORDINATION OF CARE: 7:25 PM- Will order injection of Toradol and provide resource guide for pt to establish care with PCP. Pt verbalizes understanding and agrees to plan.  Medications  ketorolac (TORADOL) injection 60 mg (not administered)  ondansetron (ZOFRAN-ODT) disintegrating tablet 4 mg (not administered)    Labs Review Labs Reviewed - No data to display   MDM   Final diagnoses:  Menstrual cramps   25 year old female presenting with menstrual cramps. Reports the cramps are typical of her menstrual cycle and she thought it was "time to get checked out". VSS. Patient is nontoxic, nonseptic appearing, in no apparent distress. Abdomen is soft without tenderness. No peritoneal signs suggesting surgical abdomen. No indication for further workup for her chronic menstrual cramps. Patient's pain and other symptoms adequately managed in emergency department with toradol and zofran. Patient discharged home with symptomatic treatment and given instructions for follow-up with their primary care physician.  I have also discussed reasons to return immediately to the ER.  Patient expresses understanding and agrees with plan.  I personally performed the services described in this documentation, which was scribed in my presence. The recorded information has been reviewed and is accurate.   Rolm Gala Damali Broadfoot, PA-C 01/08/16 2052  Azalia Bilis, MD 01/09/16 [phone]

## 2016-01-08 NOTE — ED Notes (Signed)
Pt eating Popeyes, no N/V or acute distress.

## 2016-01-08 NOTE — Discharge Instructions (Signed)
Schedule a follow up appointment with [facility] or one of the PCPs from the resource guide.   Dysmenorrhea Menstrual cramps (dysmenorrhea) are caused by the muscles of the uterus tightening (contracting) during a menstrual period. For some women, this discomfort is merely bothersome. For others, dysmenorrhea can be severe enough to interfere with everyday activities for a few days each month. Primary dysmenorrhea is menstrual cramps that last a couple of days when you start having menstrual periods or soon after. This often begins after a teenager starts having her period. As a woman gets older or has a baby, the cramps will usually lessen or disappear. Secondary dysmenorrhea begins later in life, lasts longer, and the pain may be stronger than primary dysmenorrhea. The pain may start before the period and last a few days after the period.  CAUSES  Dysmenorrhea is usually caused by an underlying problem, such as:  The tissue lining the uterus grows outside of the uterus in other areas of the body (endometriosis).  The endometrial tissue, which normally lines the uterus, is found in or grows into the muscular walls of the uterus (adenomyosis).  The pelvic blood vessels are engorged with blood just before the menstrual period (pelvic congestive syndrome).  Overgrowth of cells (polyps) in the lining of the uterus or cervix.  Falling down of the uterus (prolapse) because of loose or stretched ligaments.  Depression.  Bladder problems, infection, or inflammation.  Problems with the intestine, a tumor, or irritable bowel syndrome.  Cancer of the female organs or bladder.  A severely tipped uterus.  A very tight opening or closed cervix.  Noncancerous tumors of the uterus (fibroids).  Pelvic inflammatory disease (PID).  Pelvic scarring (adhesions) from a previous surgery.  Ovarian cyst.  An intrauterine device (IUD) used for birth control. RISK FACTORS You may be  at greater risk of dysmenorrhea if:  You are younger than age 59.  You started puberty early.  You have irregular or heavy bleeding.  You have never given birth.  You have a family history of this problem.  You are a smoker. SIGNS AND SYMPTOMS   Cramping or throbbing pain in your lower abdomen.  Headaches.  Lower back pain.  Nausea or vomiting.  Diarrhea.  Sweating or dizziness.  Loose stools. DIAGNOSIS  A diagnosis is based on your history, symptoms, physical exam, diagnostic tests, or procedures. Diagnostic tests or procedures may include:  Blood tests.  Ultrasonography.  An examination of the lining of the uterus (dilation and curettage, D&C).  An examination inside your abdomen or pelvis with a scope (laparoscopy).  X-rays.  CT scan.  MRI.  An examination inside the bladder with a scope (cystoscopy).  An examination inside the intestine or stomach with a scope (colonoscopy, gastroscopy). TREATMENT  Treatment depends on the cause of the dysmenorrhea. Treatment may include:  Pain medicine prescribed by your health care provider.  Birth control pills or an IUD with progesterone hormone in it.  Hormone replacement therapy.  Nonsteroidal anti-inflammatory drugs (NSAIDs). These may help stop the production of prostaglandins.  Surgery to remove adhesions, endometriosis, ovarian cyst, or fibroids.  Removal of the uterus (hysterectomy).  Progesterone shots to stop the menstrual period.  Cutting the nerves on the sacrum that go to the female organs (presacral neurectomy).  Electric current to the sacral nerves (sacral nerve stimulation).  Antidepressant medicine.  Psychiatric therapy, counseling, or group therapy.  Exercise and physical therapy.  Meditation and yoga therapy.  Acupuncture. HOME CARE  INSTRUCTIONS   Only take over-the-counter or prescription medicines as directed by your health care provider.  Place a heating pad or hot water  bottle on your lower back or abdomen. Do not sleep with the heating pad.  Use aerobic exercises, walking, swimming, biking, and other exercises to help lessen the cramping.  Massage to the lower back or abdomen may help.  Stop smoking.  Avoid alcohol and caffeine. SEEK MEDICAL CARE IF:   Your pain does not get better with medicine.  You have pain with sexual intercourse.  Your pain increases and is not controlled with medicines.  You have abnormal vaginal bleeding with your period.  You develop nausea or vomiting with your period that is not controlled with medicine. SEEK IMMEDIATE MEDICAL CARE IF:  You pass out.    This information is not intended to replace advice given to you by your health care provider. Make sure you discuss any questions you have with your health care provider.   Document Released: 12/06/2005 Document Revised: 08/08/2013 Document Reviewed: 05/24/2013 Elsevier Interactive Patient Education 2016 [employer].   Emergency Department Resource Guide 1) Find a Doctor and Pay Out of Pocket Although you won't have to find out who is covered by your insurance plan, it is a good idea to ask around and get recommendations. You will then need to call the office and see if the doctor you have chosen will accept you as a new patient and what types of options they offer for patients who are self-pay. Some doctors offer discounts or will set up payment plans for their patients who do not have insurance, but you will need to ask so you aren't surprised when you get to your appointment.  2) Contact Your Local Health Department Not all health departments have doctors that can see patients for sick visits, but many do, so it is worth a call to see if yours does. If you don't know where your local health department is, you can check in your phone book. The CDC also has a tool to help you locate your state's health department, and many state websites also have listings of all of  their local health departments.  3) Find a Walk-in Clinic If your illness is not likely to be very severe or complicated, you may want to try a walk in clinic. These are popping up all over the country in pharmacies, drugstores, and shopping centers. They're usually staffed by nurse practitioners or physician assistants that have been trained to treat common illnesses and complaints. They're usually fairly quick and inexpensive. However, if you have serious medical issues or chronic medical problems, these are probably not your best option.  No Primary Care Doctor: - Call Health Connect at  [phone] - they can help you locate a primary care doctor that  accepts your insurance, provides certain services, etc. - Physician Referral Service- [phone]  Chronic Pain Problems: Organization         Address  Phone   Notes  [facility]  [phone] Patients need to be referred by their primary care doctor.   Medication Assistance: Organization         Address  Phone   Notes  [facility] [address] [place] [place], [place] [address] [phone] --Must be a resident of [facility] -- Must have NO insurance coverage whatsoever (no Medicaid/ Medicare, etc.) -- The pt. MUST have a primary care doctor that directs their care  regularly and follows them in the community   [facility]  [phone]   [employer]  [phone]    Agencies that provide inexpensive medical care: Organization         Address  Phone   Notes  [facility]  [phone]   [facility]    [phone]   [facility] [address] [place], [place] [address] [phone]   [facility] [address], [place] [phone]   [facility]    [phone]   [facility]    [phone]   [facility]  [address],  [place] Phone:  [phone], Fax:  [phone] Hours of Operation:  9 am - 6 pm, M-F.  Also accepts Medicaid/Medicare and self-pay.  [facility]  [address], [place] Phone: [phone], Fax: [phone]. Hours of Operation:  8:30 am - 5:30 pm, M-F.  Also accepts Medicaid and self-pay.  [facility] [place] [address] [place] Phone: [phone]   [facility] [address] Natasha Bence [place], [place] [phone], Ext. [phone] Mondays & Thursdays: 7-9 AM.  First 15 patients are seen on a first come, first serve basis.    Medicaid-accepting [facility] Providers:  Organization         Address  Phone   Notes  [facility] [address], [place] [phone] Also accepts self-pay patients.  [facility] [address] Laurell Josephs [place], [place]  [phone]   [facility] [address], [place] [phone]   [facility] [address] [place] [phone]   Renaye Rakers [address], [place]   [phone] Only accepts [place] patients after they have their name applied to their card.   Self-Pay (no insurance) in [facility]:  Organization         Address  Phone   Notes  Sickle Cell Patients, [facility] [address] [place], [place] [phone]   [facility] [address] [place], [place] [phone]   [facility] [place]  [address], [place] [phone]   [facility]/Dr. Osei-Bonsu  [address], [place] or [address], [place] [phone] Phone number for both [place] and [place] locations is the same.  [facility] [address], [place] [phone]   [facility] [address] [place] or [address] [phone]   [facility] [address], [place] [phone], phone; [phone], fax Sees patients 1st and 3rd Saturday of every month.  Must not qualify for public or private insurance (i.e. Medicaid, Medicare, [place] Health Choice, Veterans' Benefits)  Household income should be no more than 200% of the poverty level The clinic cannot treat you if you are pregnant or think you are pregnant  Sexually transmitted diseases are not treated at the clinic.

## 2016-01-08 NOTE — ED Notes (Signed)
Pt stable, ambulatory, states understanding of discharge instructions 

## 2016-01-08 NOTE — ED Notes (Signed)
Pt states that she is on her period and is suffering cramps. States that her cramps have always been bad but she has never been seen for it.

## 2016-11-10 ENCOUNTER — Encounter (HOSPITAL_COMMUNITY): Payer: Self-pay | Admitting: *Deleted

## 2016-11-10 ENCOUNTER — Emergency Department (HOSPITAL_COMMUNITY)
Admission: EM | Admit: 2016-11-10 | Discharge: 2016-11-10 | Disposition: A | Discharge status: Home / Self Care | Payer: BLUE CROSS/BLUE SHIELD | Attending: Emergency Medicine | Admitting: Emergency Medicine

## 2016-11-10 DIAGNOSIS — K2920 Alcoholic gastritis without bleeding: Secondary | ICD-10-CM | POA: Insufficient documentation

## 2016-11-10 DIAGNOSIS — Z87891 Personal history of nicotine dependence: Secondary | ICD-10-CM | POA: Diagnosis not present

## 2016-11-10 DIAGNOSIS — E860 Dehydration: Secondary | ICD-10-CM | POA: Insufficient documentation

## 2016-11-10 DIAGNOSIS — R112 Nausea with vomiting, unspecified: Secondary | ICD-10-CM

## 2016-11-10 DIAGNOSIS — R109 Unspecified abdominal pain: Secondary | ICD-10-CM | POA: Diagnosis present

## 2016-11-10 LAB — URINALYSIS, ROUTINE W REFLEX MICROSCOPIC
BILIRUBIN URINE: NEGATIVE
GLUCOSE, UA: NEGATIVE mg/dL
HGB URINE DIPSTICK: NEGATIVE
Ketones, ur: NEGATIVE mg/dL
Leukocytes, UA: NEGATIVE
Nitrite: NEGATIVE
Protein, ur: 100 mg/dL — AB
SPECIFIC GRAVITY, URINE: 1.025 (ref 1.005–1.030)
pH: 8.5 — ABNORMAL HIGH (ref 5.0–8.0)

## 2016-11-10 LAB — COMPREHENSIVE METABOLIC PANEL
ALBUMIN: 4.3 g/dL (ref 3.5–5.0)
ALK PHOS: 103 U/L (ref 38–126)
ALT: 15 U/L (ref 14–54)
AST: 21 U/L (ref 15–41)
Anion gap: 13 (ref 5–15)
BUN: 7 mg/dL (ref 6–20)
CALCIUM: 9.7 mg/dL (ref 8.9–10.3)
CO2: 23 mmol/L (ref 22–32)
CREATININE: 0.8 mg/dL (ref 0.44–1.00)
Chloride: 107 mmol/L (ref 101–111)
GFR calc Af Amer: >60 mL/min (ref >60)
GFR calc non Af Amer: >60 mL/min (ref >60)
GLUCOSE: 121 mg/dL — AB (ref 65–99)
Potassium: 3.7 mmol/L (ref 3.5–5.1)
SODIUM: 143 mmol/L (ref 135–145)
Total Bilirubin: 0.4 mg/dL (ref 0.3–1.2)
Total Protein: 8.1 g/dL (ref 6.5–8.1)

## 2016-11-10 LAB — CBC
HCT: 42.5 % (ref 36.0–46.0)
Hemoglobin: 13.9 g/dL (ref 12.0–15.0)
MCH: 26.6 pg (ref 26.0–34.0)
MCHC: 32.7 g/dL (ref 30.0–36.0)
MCV: 81.4 fL (ref 78.0–100.0)
PLATELETS: 460 10*3/uL — AB (ref 150–400)
RBC: 5.22 MIL/uL — ABNORMAL HIGH (ref 3.87–5.11)
RDW: 13.7 % (ref 11.5–15.5)
WBC: 13.3 10*3/uL — ABNORMAL HIGH (ref 4.0–10.5)

## 2016-11-10 LAB — URINE MICROSCOPIC-ADD ON

## 2016-11-10 LAB — POC URINE PREG, ED: PREG TEST UR: NEGATIVE

## 2016-11-10 LAB — LIPASE, BLOOD: Lipase: 27 U/L (ref 11–51)

## 2016-11-10 MED ORDER — FAMOTIDINE 20 MG PO TABS: 20.0000 mg | ORAL_TABLET | Freq: Two times a day (BID) | ORAL | 0 refills | Status: DC

## 2016-11-10 MED ORDER — PROMETHAZINE HCL 25 MG PO TABS: 25.0000 mg | ORAL_TABLET | Freq: Four times a day (QID) | ORAL | 0 refills | Status: DC | PRN

## 2016-11-10 MED ORDER — ONDANSETRON 4 MG PO TBDP
8.0000 mg | ORAL_TABLET | Freq: Once | ORAL | Status: AC
  Administered 2016-11-10: 8 mg via ORAL
  Filled 2016-11-10: qty 2

## 2016-11-10 MED ORDER — PROMETHAZINE HCL 25 MG/ML IJ SOLN
25.0000 mg | Freq: Once | INTRAMUSCULAR | Status: AC
  Administered 2016-11-10: 25 mg via INTRAMUSCULAR
  Filled 2016-11-10: qty 1

## 2016-11-10 NOTE — ED Triage Notes (Signed)
The pt drtank alcohol heavily last pm and today she has had abd cramps with nausea and vomiting.  lmp yesterday
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
## 2016-11-10 NOTE — ED Provider Notes (Signed)
[facility]-EMERGENCY DEPT Provider Note   CSN: [id number] Arrival date & time: 11/10/16  [id number]     History   Chief Complaint Chief Complaint  Patient presents with  . Abdominal Pain    HPI Alexandra Harris is a 25 y.o. female.  Pt presents to the ED with n/v and abdominal pain that started this morning.  Pt admits to drinking a lot last night.  She said that she only has pain if she vomits.  She was given oral zofran in triage which has helped.  Pt tried to go to work today, but was unable to work.      History reviewed. No pertinent past medical history.  There are no active problems to display for this patient.   History reviewed. No pertinent surgical history.  OB History    No data available       Home Medications    Prior to Admission medications   Medication Sig Start Date End Date Taking? Authorizing Provider  Alum & Mag Hydroxide-Simeth (MAGIC MOUTHWASH W/LIDOCAINE) SOLN Take 5 mLs by mouth 3 (three) times daily as needed for mouth pain. 10/24/14   Heather Laisure, PA-C  cephALEXin (KEFLEX) 500 MG capsule Take 1 capsule (500 mg total) by mouth 3 (three) times daily. 10/13/12   Reuben Likesavid C Keller, MD  cephALEXin (KEFLEX) 500 MG capsule Take 1 capsule (500 mg total) by mouth 4 (four) times daily. 07/29/14   Marissa Sciacca, PA-C  clotrimazole (LOTRIMIN) 1 % cream Apply between clean and dry toes in the morning and night for 14 days. 07/29/14   Roxy Horsemanobert Browning, PA-C  famotidine (PEPCID) 20 MG tablet Take 1 tablet (20 mg total) by mouth 2 (two) times daily. 11/10/16   Jacalyn LefevreJulie Tanina Barb, MD  ibuprofen (ADVIL,MOTRIN) 600 MG tablet Take 1 tablet (600 mg total) by mouth every 6 (six) hours as needed. 07/29/14   Marissa Sciacca, PA-C  ibuprofen (ADVIL,MOTRIN) 800 MG tablet Take 1 tablet (800 mg total) by mouth 3 (three) times daily. 01/08/16   Stevi Barrett, PA-C  promethazine (PHENERGAN) 25 MG tablet Take 1 tablet (25 mg total) by mouth every 6 (six) hours as needed for nausea or vomiting.  11/10/16   Jacalyn LefevreJulie Aniqa Hare, MD  terbinafine (LAMISIL) 1 % cream Apply 1 application topically 2 (two) times daily. 07/31/14   Linna HoffJames D Kindl, MD  terbinafine (LAMISIL) 250 MG tablet Take 1 tablet (250 mg total) by mouth daily. 07/31/14   Linna HoffJames D Kindl, MD  traMADol (ULTRAM) 50 MG tablet Take 2 tablets (100 mg total) by mouth every 8 (eight) hours as needed for pain. 10/13/12   Reuben Likesavid C Keller, MD    Family History No family history on file.  Social History Social History  Substance Use Topics  . Smoking status: Former Games developermoker  . Smokeless tobacco: Never Used  . Alcohol use Yes     Allergies   Patient has no known allergies.   Review of Systems Review of Systems  Gastrointestinal: Positive for abdominal pain, nausea and vomiting.  All other systems reviewed and are negative.    Physical Exam Updated Vital Signs BP 117/76   Pulse 88   Temp 98.3 F (36.8 C)   Resp 20   Ht 5' (1.524 m)   Wt 196 lb 8 oz (89.1 kg)   LMP 11/09/2016   SpO2 98%   BMI 38.38 kg/m   Physical Exam  Constitutional: She is oriented to person, place, and time. She appears well-developed and well-nourished.  HENT:  Head:  Normocephalic and atraumatic.  Right Ear: External ear normal.  Left Ear: External ear normal.  Nose: Nose normal.  Mouth/Throat: Oropharynx is clear and moist.  Eyes: Conjunctivae and EOM are normal. Pupils are equal, round, and reactive to light.  Neck: Normal range of motion. Neck supple.  Cardiovascular: Normal rate, regular rhythm, normal heart sounds and intact distal pulses.   Pulmonary/Chest: Effort normal and breath sounds normal.  Abdominal: Soft. Bowel sounds are normal.  Musculoskeletal: Normal range of motion.  Neurological: She is alert and oriented to person, place, and time.  Skin: Skin is warm.  Psychiatric: She has a normal mood and affect. Her behavior is normal. Judgment and thought content normal.  Nursing note and vitals reviewed.    ED Treatments /  Results  Labs (all labs ordered are listed, but only abnormal results are displayed) Labs Reviewed  COMPREHENSIVE METABOLIC PANEL - Abnormal; Notable for the following:       Result Value   Glucose, Bld 121 (*)    All other components within normal limits  CBC - Abnormal; Notable for the following:    WBC 13.3 (*)    RBC 5.22 (*)    Platelets 460 (*)    All other components within normal limits  URINALYSIS, ROUTINE W REFLEX MICROSCOPIC (NOT AT [facility]) - Abnormal; Notable for the following:    APPearance CLOUDY (*)    pH 8.5 (*)    Protein, ur 100 (*)    All other components within normal limits  URINE MICROSCOPIC-ADD ON - Abnormal; Notable for the following:    Squamous Epithelial / LPF 6-30 (*)    Bacteria, UA FEW (*)    All other components within normal limits  LIPASE, BLOOD  POC URINE PREG, ED    EKG  EKG Interpretation None       Radiology No results found.  Procedures Procedures (including critical care time)  Medications Ordered in ED Medications  promethazine (PHENERGAN) injection 25 mg (not administered)  ondansetron (ZOFRAN-ODT) disintegrating tablet 8 mg (8 mg Oral Given 11/10/16 1930)     Initial Impression / Assessment and Plan / ED Course  I have reviewed the triage vital signs and the nursing notes.  Pertinent labs & imaging results that were available during my care of the patient were reviewed by me and considered in my medical decision making (see chart for details).  Clinical Course    Pt is feeling better.  She will be d/c'd with phenergan and pepcid.  She knows to return if worse.  She is given a note for work.  Final Clinical Impressions(s) / ED Diagnoses   Final diagnoses:  Acute alcoholic gastritis without hemorrhage  Dehydration  Non-intractable vomiting with nausea, unspecified vomiting type    New Prescriptions New Prescriptions   FAMOTIDINE (PEPCID) 20 MG TABLET    Take 1 tablet (20 mg total) by mouth 2 (two) times daily.    PROMETHAZINE (PHENERGAN) 25 MG TABLET    Take 1 tablet (25 mg total) by mouth every 6 (six) hours as needed for nausea or vomiting.     Jacalyn LefevreJulie Lavonne Kinderman, MD 11/10/16 2053

## 2016-11-23 ENCOUNTER — Encounter (HOSPITAL_COMMUNITY): Payer: Self-pay | Admitting: Emergency Medicine

## 2016-11-23 ENCOUNTER — Emergency Department (HOSPITAL_COMMUNITY)
Admission: EM | Admit: 2016-11-23 | Discharge: 2016-11-23 | Disposition: A | Discharge status: Home / Self Care | Payer: BLUE CROSS/BLUE SHIELD | Attending: Emergency Medicine | Admitting: Emergency Medicine

## 2016-11-23 ENCOUNTER — Emergency Department (HOSPITAL_COMMUNITY): Payer: BLUE CROSS/BLUE SHIELD

## 2016-11-23 DIAGNOSIS — Z87891 Personal history of nicotine dependence: Secondary | ICD-10-CM | POA: Diagnosis not present

## 2016-11-23 DIAGNOSIS — J069 Acute upper respiratory infection, unspecified: Secondary | ICD-10-CM | POA: Diagnosis not present

## 2016-11-23 DIAGNOSIS — R05 Cough: Secondary | ICD-10-CM | POA: Diagnosis present

## 2016-11-23 LAB — RAPID STREP SCREEN (MED CTR MEBANE ONLY): STREPTOCOCCUS, GROUP A SCREEN (DIRECT): NEGATIVE

## 2016-11-23 MED ORDER — BENZONATATE 100 MG PO CAPS
100.0000 mg | ORAL_CAPSULE | Freq: Once | ORAL | Status: AC
  Administered 2016-11-23: 100 mg via ORAL
  Filled 2016-11-23: qty 1

## 2016-11-23 MED ORDER — HYDROCODONE-HOMATROPINE 5-1.5 MG/5ML PO SYRP: 5.0000 mL | ORAL_SOLUTION | Freq: Four times a day (QID) | ORAL | 0 refills | Status: DC | PRN

## 2016-11-23 NOTE — ED Provider Notes (Signed)
[facility]-EMERGENCY DEPT Provider Note   CSN: [id number] Arrival date & time: 11/23/16  1157  By signing my name below, I, Javier Dockerobert Ryan Halas, attest that this documentation has been prepared under the direction and in the presence of Cheri FowlerKayla Maeson Lourenco, PA-C. Electronically Signed: Javier Dockerobert Ryan Halas, ER Scribe. 07/31/2016. 12:36 PM.  History   Chief Complaint Chief Complaint  Patient presents with  . Sore Throat  . Cough   HPI  HPI Comments: Alexandra Harris is a 25 y.o. female who presents to the Emergency Department complaining gradual onset, intermittent, moderate three day history of worsening sore throat and cough. She coughed so hard she vomited last night (two events). She endorses associated myalgias, SOB after coughing, and trouble sleeping due to cough. She denies wheezing or hx of asthma. Non-smoker.  She has taken cough drops and alka seltzer without relief. She denies fever, chest pain, otalgia, HA, neck stiffness, neck pain, or abdominal pain.  No sick contacts.    History reviewed. No pertinent past medical history.  There are no active problems to display for this patient.   History reviewed. No pertinent surgical history.  OB History    No data available       Home Medications    Prior to Admission medications   Medication Sig Start Date End Date Taking? Authorizing Provider  Alum & Mag Hydroxide-Simeth (MAGIC MOUTHWASH W/LIDOCAINE) SOLN Take 5 mLs by mouth 3 (three) times daily as needed for mouth pain. 10/24/14   Heather Laisure, PA-C  cephALEXin (KEFLEX) 500 MG capsule Take 1 capsule (500 mg total) by mouth 3 (three) times daily. 10/13/12   Reuben Likesavid C Keller, MD  cephALEXin (KEFLEX) 500 MG capsule Take 1 capsule (500 mg total) by mouth 4 (four) times daily. 07/29/14   Marissa Sciacca, PA-C  clotrimazole (LOTRIMIN) 1 % cream Apply between clean and dry toes in the morning and night for 14 days. 07/29/14   Roxy Horsemanobert Browning, PA-C  famotidine (PEPCID) 20 MG tablet Take 1 tablet (20 mg  total) by mouth 2 (two) times daily. 11/10/16   Jacalyn LefevreJulie Haviland, MD  HYDROcodone-homatropine [facility](HYCODAN) 5-1.5 MG/5ML syrup Take 5 mLs by mouth every 6 (six) hours as needed for cough. 11/23/16   Cheri FowlerKayla Hollace Michelli, PA-C  ibuprofen (ADVIL,MOTRIN) 600 MG tablet Take 1 tablet (600 mg total) by mouth every 6 (six) hours as needed. 07/29/14   Marissa Sciacca, PA-C  ibuprofen (ADVIL,MOTRIN) 800 MG tablet Take 1 tablet (800 mg total) by mouth 3 (three) times daily. 01/08/16   Stevi Barrett, PA-C  promethazine (PHENERGAN) 25 MG tablet Take 1 tablet (25 mg total) by mouth every 6 (six) hours as needed for nausea or vomiting. 11/10/16   Jacalyn LefevreJulie Haviland, MD  terbinafine (LAMISIL) 1 % cream Apply 1 application topically 2 (two) times daily. 07/31/14   Linna HoffJames D Kindl, MD  terbinafine (LAMISIL) 250 MG tablet Take 1 tablet (250 mg total) by mouth daily. 07/31/14   Linna HoffJames D Kindl, MD  traMADol (ULTRAM) 50 MG tablet Take 2 tablets (100 mg total) by mouth every 8 (eight) hours as needed for pain. 10/13/12   Reuben Likesavid C Keller, MD    Family History No family history on file.  Social History Social History  Substance Use Topics  . Smoking status: Former Games developermoker  . Smokeless tobacco: Never Used  . Alcohol use Yes     Allergies   Patient has no known allergies.   Review of Systems Review of Systems  Constitutional: Negative for chills and fever.  HENT:  Positive for sore throat. Negative for ear pain.   Respiratory: Positive for cough and shortness of breath.   Gastrointestinal: Positive for vomiting. Negative for nausea.     Physical Exam Updated Vital Signs BP 123/83   Pulse 91   Temp 98.6 F (37 C)   Resp 16   Ht 5' (1.524 m)   Wt 88.9 kg   LMP 11/03/2016   SpO2 98%   BMI 38.28 kg/m   Physical Exam  Constitutional: She is oriented to person, place, and time. She appears well-developed and well-nourished. No distress.  HENT:  Head: Normocephalic and atraumatic.  Right Ear: Tympanic membrane, external ear  and ear canal normal.  Left Ear: Tympanic membrane, external ear and ear canal normal.  Nose: Rhinorrhea present. Right sinus exhibits no maxillary sinus tenderness and no frontal sinus tenderness. Left sinus exhibits no maxillary sinus tenderness and no frontal sinus tenderness.  Mouth/Throat: No uvula swelling. No oropharyngeal exudate, posterior oropharyngeal edema or posterior oropharyngeal erythema.  Eyes: Conjunctivae and EOM are normal. Pupils are equal, round, and reactive to light.  Neck: Neck supple.  Cardiovascular: Normal rate and regular rhythm.  Exam reveals no gallop, no distant heart sounds and no friction rub.   No murmur heard. Pulmonary/Chest: Effort normal. No respiratory distress. She has no decreased breath sounds. She has no wheezes. She has no rhonchi.  Musculoskeletal: Normal range of motion.  Lymphadenopathy:       Head (right side): No submandibular, no tonsillar, no preauricular and no posterior auricular adenopathy present.       Head (left side): No submandibular, no tonsillar, no preauricular and no posterior auricular adenopathy present.  Neurological: She is alert and oriented to person, place, and time. Coordination normal.  Skin: Skin is warm and dry. She is not diaphoretic.  Psychiatric: She has a normal mood and affect. Her behavior is normal.  Nursing note and vitals reviewed.  ED Treatments / Results  Labs (all labs ordered are listed, but only abnormal results are displayed) Labs Reviewed  RAPID STREP SCREEN (NOT AT [facility])  CULTURE, GROUP A STREP [facility])    EKG  EKG Interpretation None       Radiology Dg Chest 2 View  Result Date: 11/23/2016 CLINICAL DATA:  Sore throat with cough and shortness of breath for 4 days. EXAM: CHEST  2 VIEW COMPARISON:  None. FINDINGS: The heart size and mediastinal contours are within normal limits. Both lungs are clear. The visualized skeletal structures are unremarkable. IMPRESSION: No active cardiopulmonary  disease. Electronically Signed   By: Kennith Center M.D.   On: 11/23/2016 13:14    Procedures Procedures (including critical care time)  Medications Ordered in ED Medications  benzonatate (TESSALON) capsule 100 mg (100 mg Oral Given 11/23/16 1306)     Initial Impression / Assessment and Plan / ED Course  I have reviewed the triage vital signs and the nursing notes.  Pertinent labs & imaging results that were available during my care of the patient were reviewed by me and considered in my medical decision making (see chart for details).  Clinical Course    Pt symptoms consistent with URI. Rapid strep negative.  CXR negative for acute infiltrate. Pt will be discharged with symptomatic treatment.  Discussed return precautions.  Pt is hemodynamically stable & in NAD prior to discharge.   Final Clinical Impressions(s) / ED Diagnoses   Final diagnoses:  Upper respiratory tract infection, unspecified type    New Prescriptions New Prescriptions  HYDROCODONE-HOMATROPINE (HYCODAN) 5-1.5 MG/5ML SYRUP    Take 5 mLs by mouth every 6 (six) hours as needed for cough.   I personally performed the services described in this documentation, which was scribed in my presence. The recorded information has been reviewed and is accurate.       Cheri FowlerKayla Keilani Terrance, PA-C 11/23/16 1348    Nira ConnPedro Eduardo Cardama, MD 11/25/16 [phone]

## 2016-11-23 NOTE — ED Triage Notes (Signed)
Patient reports sore throat, productive cough, and nasal congestion starting on Saturday morning.

## 2016-11-26 LAB — CULTURE, GROUP A STREP (THRC)

## 2018-01-12 ENCOUNTER — Other Ambulatory Visit (HOSPITAL_COMMUNITY)
Admission: RE | Admit: 2018-01-12 | Discharge: 2018-01-12 | Disposition: A | Discharge status: Home / Self Care | Payer: 59 | Source: Ambulatory Visit | Attending: Nurse Practitioner | Admitting: Nurse Practitioner

## 2018-01-12 ENCOUNTER — Other Ambulatory Visit: Payer: Self-pay | Admitting: Nurse Practitioner

## 2018-01-12 DIAGNOSIS — Z01419 Encounter for gynecological examination (general) (routine) without abnormal findings: Secondary | ICD-10-CM | POA: Diagnosis present

## 2018-01-16 LAB — CYTOLOGY - PAP
Chlamydia: NEGATIVE
Diagnosis: NEGATIVE
Neisseria Gonorrhea: NEGATIVE

## 2018-03-27 ENCOUNTER — Encounter: Payer: 59 | Attending: Internal Medicine | Admitting: Registered"

## 2018-03-27 ENCOUNTER — Encounter: Payer: Self-pay | Admitting: Registered"

## 2018-03-27 DIAGNOSIS — E119 Type 2 diabetes mellitus without complications: Secondary | ICD-10-CM | POA: Diagnosis not present

## 2018-03-27 DIAGNOSIS — Z713 Dietary counseling and surveillance: Secondary | ICD-10-CM | POA: Diagnosis not present

## 2018-03-27 NOTE — Patient Instructions (Signed)
Goals:  Follow Diabetes Meal Plan as instructed  Eat 3 meals and 2 snacks, every 3-5 hrs  Limit carbohydrate intake to 30-45 grams carbohydrate/meal  Limit carbohydrate intake to 15-30 grams carbohydrate/snack  Add lean protein foods to meals/snacks  Monitor glucose levels as instructed by your doctor  Aim for 30 mins of physical activity daily  Bring food record and glucose log to your next nutrition visit  Follow-up with dietitian yearly.

## 2018-03-27 NOTE — Progress Notes (Signed)
Diabetes Self-Management Education  Visit Type: First/Initial  Appt. Start Time: 2:05 Appt. End Time: 3:35  03/27/2018   Pt expectations: tips/suggestions to make blood sugar lower, learning how to monitor daily blood sugar.   [name], identified by name and date of birth, is a 27 y.o. female with a diagnosis of Diabetes: Type 2. Pt was given glucometer.  Meter Provided: Yes  If Yes, Brand: Contour Next Lot #: [id number] Expiration Date: 05/20/2019  ASSESSMENT  Height 5' (1.524 m), weight 220 lb 1.6 oz (99.8 kg). Body mass index is 42.99 kg/m.  Diabetes Self-Management Education - 03/27/18 1413      Visit Information   Visit Type  First/Initial      Initial Visit   Diabetes Type  Type 2    Are you currently following a meal plan?  No    Are you taking your medications as prescribed?  Yes    Date Diagnosed  March 2019      Health Coping   How would you rate your overall health?  Fair      Psychosocial Assessment   Patient Belief/Attitude about Diabetes  Afraid    Self-care barriers  None    Self-management support  Friends;Family    Other persons present  Patient    Patient Concerns  Monitoring    Special Needs  None    Preferred Learning Style  No preference indicated    Learning Readiness  Ready    How often do you need to have someone help you when you read instructions, pamphlets, or other written materials from your doctor or pharmacy?  1 - Never    What is the last grade level you completed in school?  12th grade      Complications   Last HgB A1C per patient/outside source  7.9 % per referral    How often do you check your blood sugar?  0 times/day (not testing)    Have you had a dilated eye exam in the past 12 months?  No    Have you had a dental exam in the past 12 months?  Yes    Are you checking your feet?  No      Dietary Intake   Breakfast  [employer] [place] bar    Snack (morning)  none    Lunch  tacos ([place], lettuce, tomatoes, cheese, soft  taco shell)    Snack (afternoon)  pecan cookies    Dinner  chicken wings, rice, peas    Snack (evening)  none    Beverage(s)  water, gatorade, sodas (occasionally), beer, alcohol      Exercise   How many days per week to you exercise?  0    How many minutes per day do you exercise?  0    Total minutes per week of exercise  0      Patient Education   Previous Diabetes Education  No    Disease state   Definition of diabetes, type 1 and 2, and the diagnosis of diabetes;Factors that contribute to the development of diabetes    Nutrition management   Food label reading, portion sizes and measuring food.;Reviewed blood glucose goals for pre and post meals and how to evaluate the patients' food intake on their blood glucose level.;Effects of alcohol on blood glucose and safety factors with consumption of alcohol.;Carbohydrate counting;Meal options for control of blood glucose level and chronic complications.    Physical activity and exercise   Role of exercise on  diabetes management, blood pressure control and cardiac health.;Helped patient identify appropriate exercises in relation to his/her diabetes, diabetes complications and other health issue.    Medications  Reviewed patients medication for diabetes, action, purpose, timing of dose and side effects.    Monitoring  Taught/evaluated SMBG meter.;Purpose and frequency of SMBG.;Taught/discussed recording of test results and interpretation of SMBG.;Identified appropriate SMBG and/or A1C goals.;Daily foot exams;Yearly dilated eye exam;Interpreting lab values - A1C, lipid, urine microalbumina.    Acute complications  Taught treatment of hypoglycemia - the 15 rule.;Discussed and identified patients' treatment of hyperglycemia.    Chronic complications  Relationship between chronic complications and blood glucose control;Assessed and discussed foot care and prevention of foot problems;Identified and discussed with patient  current chronic  complications;Retinopathy and reason for yearly dilated eye exams;Lipid levels, blood glucose control and heart disease;Dental care;Reviewed with patient heart disease, higher risk of, and prevention    Psychosocial adjustment  Role of stress on diabetes;Helped patient identify a support system for diabetes management;Identified and addressed patients feelings and concerns about diabetes    Personal strategies to promote health  Review risk of smoking and offered smoking cessation      Individualized Goals (developed by patient)   Nutrition  General guidelines for healthy choices and portions discussed    Physical Activity  30 minutes per day    Medications  take my medication as prescribed    Monitoring   test my blood glucose as discussed    Reducing Risk  stop smoking;treat hypoglycemia with 15 grams of carbs if blood glucose less than 70mg /dL;increase portions of nuts and seeds;do foot checks daily    Health Coping  Not Applicable      Post-Education Assessment   Patient understands the diabetes disease and treatment process.  Demonstrates understanding / competency    Patient understands incorporating nutritional management into lifestyle.  Demonstrates understanding / competency    Patient undertands incorporating physical activity into lifestyle.  Demonstrates understanding / competency    Patient understands using medications safely.  Demonstrates understanding / competency    Patient understands monitoring blood glucose, interpreting and using results  Demonstrates understanding / competency    Patient understands prevention, detection, and treatment of acute complications.  Demonstrates understanding / competency    Patient understands prevention, detection, and treatment of chronic complications.  Demonstrates understanding / competency    Patient understands how to develop strategies to address psychosocial issues.  Demonstrates understanding / competency    Patient understands how  to develop strategies to promote health/change behavior.  Demonstrates understanding / competency      Outcomes   Expected Outcomes  Demonstrated interest in learning. Expect positive outcomes    Future DMSE  Yearly    Program Status  Completed       Individualized Plan for Diabetes Self-Management Training:   Learning Objective:  Patient will have a greater understanding of diabetes self-management. Patient education plan is to attend individual and/or group sessions per assessed needs and concerns.   Plan:   Patient Instructions  Goals:  Follow Diabetes Meal Plan as instructed  Eat 3 meals and 2 snacks, every 3-5 hrs  Limit carbohydrate intake to 30-45 grams carbohydrate/meal  Limit carbohydrate intake to 15-30 grams carbohydrate/snack  Add lean protein foods to meals/snacks  Monitor glucose levels as instructed by your doctor  Aim for 30 mins of physical activity daily  Bring food record and glucose log to your next nutrition visit  Follow-up with dietitian yearly.  Expected Outcomes:  Demonstrated interest in learning. Expect positive outcomes  Education material provided: Living Well with Diabetes, A1C conversion sheet, Meal plan card, My Plate, Snack sheet, Support group flyer, Carbohydrate counting sheet and No sodium seasonings  If problems or questions, patient to contact team via:  Phone and Email  Future DSME appointment: Yearly

## 2018-07-24 ENCOUNTER — Ambulatory Visit (HOSPITAL_COMMUNITY)
Admission: EM | Admit: 2018-07-24 | Discharge: 2018-07-24 | Disposition: A | Discharge status: Home / Self Care | Payer: Managed Care, Other (non HMO) | Attending: Family Medicine | Admitting: Family Medicine

## 2018-07-24 ENCOUNTER — Encounter (HOSPITAL_COMMUNITY): Payer: Self-pay

## 2018-07-24 DIAGNOSIS — M76892 Other specified enthesopathies of left lower limb, excluding foot: Secondary | ICD-10-CM

## 2018-07-24 MED ORDER — NAPROXEN 500 MG PO TABS: 500.0000 mg | ORAL_TABLET | Freq: Two times a day (BID) | ORAL | 0 refills | Status: DC

## 2018-07-24 NOTE — Discharge Instructions (Signed)
Ice application, especially after use.  Naproxen, twice a day, take with food.  Use of knee sleeve for compression and support.  If symptoms worsen or do not improve in the next 2-3 weeks to follow up with sports med clinic.

## 2018-07-24 NOTE — ED Provider Notes (Signed)
[facility]    CSN: [id number] Arrival date & time: 07/24/18  0806     History   Chief Complaint Chief Complaint  Patient presents with  . Knee Pain    left knee    HPI Alexandra Harris is a 27 y.o. female.   Alexandra Harris presents with complaints of left knee pain which started approximately 1 week ago. No specific injury or trauma. No precipitating factors. Worse when she wakes in the morning but worse primarily with activity. Worse with going up stairs for example. Pain 10/10 with activity. Felt like her knee was about to give out two days ago, although it did not no. No numbness or tingling. No previous knee injury. No broken skin, redness, warm, swelling.  Took ibuprofen 2 days ago which did not help with pain. Took a muscle relaxer yesterday which seemed to help. Without contributing medical history.      ROS per HPI.      Past Medical History:  Diagnosis Date  . Depression     There are no active problems to display for this patient.   History reviewed. No pertinent surgical history.  OB History   None      Home Medications    Prior to Admission medications   Medication Sig Start Date End Date Taking? Authorizing Provider  metFORMIN (GLUCOPHAGE) 500 MG tablet Take by mouth 2 (two) times daily with a meal.    [provider]  naproxen (NAPROSYN) 500 MG tablet Take 1 tablet (500 mg total) by mouth 2 (two) times daily. 07/24/18   Georgetta Haber, NP  norethindrone (MICRONOR,CAMILA,ERRIN) 0.35 MG tablet Take 1 tablet by mouth daily.    [provider]    Family History Family History  Problem Relation Age of Onset  . Diabetes Other     Social History Social History   Tobacco Use  . Smoking status: Former Games developer  . Smokeless tobacco: Never Used  Substance Use Topics  . Alcohol use: Yes  . Drug use: No     Allergies   Patient has no known allergies.   Review of Systems Review of Systems   Physical Exam Triage  Vital Signs ED Triage Vitals  Enc Vitals Group     BP --      Pulse Rate 07/24/18 0827 75     Resp 07/24/18 0827 20     Temp 07/24/18 0827 98.9 F (37.2 C)     Temp Source 07/24/18 0827 Oral     SpO2 07/24/18 0827 99 %     Weight --      Height --      Head Circumference --      Peak Flow --      Pain Score 07/24/18 0825 9     Pain Loc --      Pain Edu? --      Excl. in GC? --    No data found.  Updated Vital Signs Pulse 75   Temp 98.9 F (37.2 C) (Oral)   Resp 20   LMP 07/19/2018   SpO2 99%    Physical Exam  Constitutional: She is oriented to person, place, and time. She appears well-developed and well-nourished. No distress.  Cardiovascular: Normal rate, regular rhythm and normal heart sounds.  Pulmonary/Chest: Effort normal and breath sounds normal.  Musculoskeletal:       Left knee: She exhibits effusion. She exhibits normal range of motion, no swelling, no ecchymosis, no deformity, no laceration, no  erythema, normal alignment, no LCL laxity, normal patellar mobility, no bony tenderness and no MCL laxity. Tenderness found. Patellar tendon tenderness noted.       Left ankle: Normal.       Left lower leg: Normal.  Clicking with extension; mild effusion noted; no laxity; negative anterior drawer; no redness or generalized swelling; strength equal bilaterally; gross sensation intact; ambulatory   Neurological: She is alert and oriented to person, place, and time.  Skin: Skin is warm and dry.     UC Treatments / Results  Labs (all labs ordered are listed, but only abnormal results are displayed) Labs Reviewed - No data to display  EKG None  Radiology No results found.  Procedures Procedures (including critical care time)  Medications Ordered in UC Medications - No data to display  Initial Impression / Assessment and Plan / UC Course  I have reviewed the triage vital signs and the nursing notes.  Pertinent labs & imaging results that were available during  my care of the patient were reviewed by me and considered in my medical decision making (see chart for details).     No specific injury, no trauma, no bony tenderness. Imaging deferred today. Appears consistent with a tendonitis. RICE, sleeve, nsaids for pain control. Follow up with sports med if no improvement or if worsening. Patient verbalized understanding and agreeable to plan.    Final Clinical Impressions(s) / UC Diagnoses   Final diagnoses:  Tendonitis of knee, left     Discharge Instructions     Ice application, especially after use.  Naproxen, twice a day, take with food.  Use of knee sleeve for compression and support.  If symptoms worsen or do not improve in the next 2-3 weeks to follow up with sports med clinic.      ED Prescriptions    Medication Sig Dispense Auth. Provider   naproxen (NAPROSYN) 500 MG tablet Take 1 tablet (500 mg total) by mouth 2 (two) times daily. 30 tablet Georgetta HaberBurky, Keenen Roessner B, NP     Controlled Substance Prescriptions  Controlled Substance Registry consulted? Not Applicable   Georgetta HaberBurky, Shonna Deiter B, NP 07/24/18 [phone]

## 2018-07-24 NOTE — ED Triage Notes (Signed)
Pt presents with left knee pain from unknown source

## 2019-08-07 ENCOUNTER — Other Ambulatory Visit: Payer: Self-pay

## 2019-08-07 DIAGNOSIS — Z20822 Contact with and (suspected) exposure to covid-19: Secondary | ICD-10-CM

## 2019-08-08 LAB — NOVEL CORONAVIRUS, NAA: SARS-CoV-2, NAA: NOT DETECTED

## 2019-12-06 ENCOUNTER — Encounter (HOSPITAL_COMMUNITY): Payer: Self-pay

## 2019-12-06 ENCOUNTER — Other Ambulatory Visit: Payer: Self-pay

## 2019-12-06 ENCOUNTER — Ambulatory Visit (HOSPITAL_COMMUNITY)
Admission: EM | Admit: 2019-12-06 | Discharge: 2019-12-06 | Disposition: A | Discharge status: Home / Self Care | Payer: BC Managed Care – PPO | Attending: Internal Medicine | Admitting: Internal Medicine

## 2019-12-06 DIAGNOSIS — N898 Other specified noninflammatory disorders of vagina: Secondary | ICD-10-CM

## 2019-12-06 DIAGNOSIS — N760 Acute vaginitis: Secondary | ICD-10-CM | POA: Diagnosis not present

## 2019-12-06 DIAGNOSIS — R300 Dysuria: Secondary | ICD-10-CM | POA: Diagnosis not present

## 2019-12-06 DIAGNOSIS — Z131 Encounter for screening for diabetes mellitus: Secondary | ICD-10-CM | POA: Diagnosis not present

## 2019-12-06 LAB — HEMOGLOBIN A1C
Hgb A1c MFr Bld: 8.2 % — ABNORMAL HIGH (ref 4.8–5.6)
Mean Plasma Glucose: 188.64 mg/dL

## 2019-12-06 LAB — POCT URINALYSIS DIP (DEVICE)
Bilirubin Urine: NEGATIVE
Glucose, UA: NEGATIVE mg/dL
Hgb urine dipstick: NEGATIVE
Leukocytes,Ua: NEGATIVE
Nitrite: NEGATIVE
Protein, ur: NEGATIVE mg/dL
Specific Gravity, Urine: >=1.03 (ref 1.005–1.030)
Urobilinogen, UA: 0.2 mg/dL (ref 0.0–1.0)
pH: 6 (ref 5.0–8.0)

## 2019-12-06 LAB — GLUCOSE, CAPILLARY: Glucose-Capillary: 157 mg/dL — ABNORMAL HIGH (ref 70–99)

## 2019-12-06 LAB — CBG MONITORING, ED: Glucose-Capillary: 157 mg/dL — ABNORMAL HIGH (ref 70–99)

## 2019-12-06 MED ORDER — METFORMIN HCL 500 MG PO TABS: 500.0000 mg | ORAL_TABLET | Freq: Two times a day (BID) | ORAL | 1 refills | Status: DC

## 2019-12-06 MED ORDER — FLUCONAZOLE 150 MG PO TABS: 150.0000 mg | ORAL_TABLET | Freq: Once | ORAL | 0 refills | Status: AC
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
## 2019-12-06 NOTE — ED Triage Notes (Signed)
Pt presents to the UC with urinating discomfort, white vaginal discharge and vaginal itchy x 6 days. Pt states she did a 3 days OTC yeast infection treatment without relief.

## 2019-12-11 ENCOUNTER — Telehealth: Payer: Self-pay | Admitting: Emergency Medicine

## 2019-12-11 LAB — CERVICOVAGINAL ANCILLARY ONLY
Candida vaginitis: NEGATIVE
Chlamydia: NEGATIVE
Neisseria Gonorrhea: NEGATIVE
Trichomonas: NEGATIVE

## 2019-12-11 MED ORDER — METRONIDAZOLE 500 MG PO TABS: 500.0000 mg | ORAL_TABLET | Freq: Two times a day (BID) | ORAL | 0 refills | Status: AC

## 2019-12-11 NOTE — Telephone Encounter (Signed)
Patient contacted by phone and made aware of    results. Pt verbalized understanding and had all questions answered.  She is requesting treatment for BV instead of retesting. Okay to send flagyl per Claiborne Billings. Pt instructed she needs follow up with PCP if symptoms are not improving and for ongoing management of diabetes. Pt verbalized understanding, all questions answered.

## 2019-12-11 NOTE — Telephone Encounter (Signed)
HgB A1c is elevated at 8.2, pt was placed on metformin. Will need follow up with PCP for ongoing management.  On cervical swab, they were unable to get results for bacterial vaginosis. Attempted to reach patient to discuss, no answer, left VM to return call.

## 2020-01-14 ENCOUNTER — Other Ambulatory Visit: Payer: Self-pay

## 2020-01-14 ENCOUNTER — Ambulatory Visit (HOSPITAL_COMMUNITY)
Admission: EM | Admit: 2020-01-14 | Discharge: 2020-01-14 | Disposition: A | Discharge status: Home / Self Care | Payer: BC Managed Care – PPO | Attending: Family Medicine | Admitting: Family Medicine

## 2020-01-14 ENCOUNTER — Encounter (HOSPITAL_COMMUNITY): Payer: Self-pay

## 2020-01-14 DIAGNOSIS — R197 Diarrhea, unspecified: Secondary | ICD-10-CM

## 2020-01-14 DIAGNOSIS — R1084 Generalized abdominal pain: Secondary | ICD-10-CM

## 2020-01-14 NOTE — ED Triage Notes (Addendum)
Patient presents to Urgent Care with complaints of lower abdominal pain since the past few days. Patient reports at first she thought it was her period but then her period stopped and the pain persisted. Pt has had diarrhea all day and it it is watery. No N/V.  Pt was on a course of antibiotics two weeks ago for a vaginal infection.

## 2020-01-14 NOTE — ED Provider Notes (Signed)
[place]    CSN: [id number] Arrival date & time: 01/14/20  1855      History   Chief Complaint Chief Complaint  Patient presents with  . Abdominal Pain    HPI Alexandra Harris is a 29 y.o. female.   HPI Patient states she has had lower abdominal crampy pain for 3 days.  She started having loose bowels yesterday.  Today she has had spells all day long.  Most the time is watery.  Sometimes soft.  No blood in view of bowels.  No mucus.  No fever or chills.  She has not eaten any suspicious foods.  She has not done any travel.  She does not think she has food poisoning.  She lives by herself.  She has not been around anyone who was sick.  She is never had any problems with: Her colon, colitis, diverticulitis.  No abdominal surgeries. She states that she recently started back on metformin but has taken this previously with no difficulty. She states she had a course of 7 days of metronidazole last month.  She took this without difficulty.  She is never had trouble with metronidazole before. Past Medical History:  Diagnosis Date  . Depression     There are no problems to display for this patient.   History reviewed. No pertinent surgical history.  OB History   No obstetric history on file.      Home Medications    Prior to Admission medications   Medication Sig Start Date End Date Taking? Authorizing Provider  metFORMIN (GLUCOPHAGE) 500 MG tablet Take 1 tablet (500 mg total) by mouth 2 (two) times daily with a meal. 12/06/19 01/14/20 Yes Lamptey, Myrene Galas, MD  norethindrone (MICRONOR,CAMILA,ERRIN) 0.35 MG tablet Take 1 tablet by mouth daily.  12/06/19  [provider]    Family History Family History  Problem Relation Age of Onset  . Diabetes Other   . Healthy Mother   . Diabetes Father     Social History Social History   Tobacco Use  . Smoking status: Current Every Day Smoker    Packs/day: 0.20    Types: Cigarettes  . Smokeless tobacco:  Never Used  Substance Use Topics  . Alcohol use: Yes    Comment: socially  . Drug use: No     Allergies   Patient has no known allergies.   Review of Systems Review of Systems  Constitutional: Negative for chills and fever.  Gastrointestinal: Positive for abdominal pain and diarrhea. Negative for blood in stool, nausea and vomiting.       Waves of crampy abdominal pain associated with loose bowels  Musculoskeletal: Negative for myalgias, neck pain and neck stiffness.     Physical Exam Triage Vital Signs ED Triage Vitals  Enc Vitals Group     BP 01/14/20 1931 117/89     Pulse Rate 01/14/20 1931 74     Resp 01/14/20 1931 16     Temp 01/14/20 1931 98.8 F (37.1 C)     Temp Source 01/14/20 1931 Oral     SpO2 01/14/20 1931 100 %     Weight --      Height --      Head Circumference --      Peak Flow --      Pain Score 01/14/20 1928 7     Pain Loc --      Pain Edu? --      Excl. in [place]? --  No data found.  Updated Vital Signs BP 117/89 (BP Location: Right Arm)   Pulse 74   Temp 98.8 F (37.1 C) (Oral)   Resp 16   SpO2 100%       Physical Exam Constitutional:      General: She is not in acute distress.    Appearance: She is well-developed. She is obese. She is not ill-appearing.  HENT:     Head: Normocephalic and atraumatic.     Mouth/Throat:     Mouth: Mucous membranes are moist.  Eyes:     Conjunctiva/sclera: Conjunctivae normal.     Pupils: Pupils are equal, round, and reactive to light.  Cardiovascular:     Rate and Rhythm: Normal rate.     Heart sounds: Normal heart sounds.  Pulmonary:     Effort: Pulmonary effort is normal. No respiratory distress.     Breath sounds: Normal breath sounds.  Abdominal:     General: Abdomen is protuberant. Bowel sounds are normal. There is no distension.     Palpations: Abdomen is soft.     Tenderness: There is no abdominal tenderness.  Musculoskeletal:        General: Normal range of motion.     Cervical back:  Normal range of motion.  Skin:    General: Skin is warm and dry.  Neurological:     Mental Status: She is alert.  Psychiatric:        Mood and Affect: Mood normal.        Behavior: Behavior normal.      Abdomen is protuberant.  Soft and benign.  No tenderness.  No organomegaly.    UC Treatments / Results  Labs (all labs ordered are listed, but only abnormal results are displayed) Labs Reviewed - No data to display  EKG   Radiology No results found.  Procedures Procedures (including critical care time)  Medications Ordered in UC Medications - No data to display  Initial Impression / Assessment and Plan / UC Course  I have reviewed the triage vital signs and the nursing notes.  Pertinent labs & imaging results that were available during my care of the patient were reviewed by me and considered in my medical decision making (see chart for details).     Reviewed causes of diarrhea.  Treatment at home.  Reasons for return Final Clinical Impressions(s) / UC Diagnoses   Final diagnoses:  Generalized abdominal pain  Diarrhea of presumed infectious origin     Discharge Instructions     Drink plenty of fluids Bland diet as tolerated May take OTC medicine to slow diarrhea  Call or return if not improving in a couple of days or if you feel weak   ED Prescriptions    None     PDMP not reviewed this encounter.   Eustace Moore, MD 01/14/20 2018

## 2020-01-14 NOTE — Discharge Instructions (Signed)
Drink plenty of fluids Bland diet as tolerated May take OTC medicine to slow diarrhea  Call or return if not improving in a couple of days or if you feel weak

## 2020-01-29 ENCOUNTER — Other Ambulatory Visit: Payer: Self-pay

## 2020-01-29 DIAGNOSIS — R1030 Lower abdominal pain, unspecified: Secondary | ICD-10-CM | POA: Insufficient documentation

## 2020-01-29 DIAGNOSIS — F17210 Nicotine dependence, cigarettes, uncomplicated: Secondary | ICD-10-CM | POA: Insufficient documentation

## 2020-01-29 DIAGNOSIS — R197 Diarrhea, unspecified: Secondary | ICD-10-CM | POA: Diagnosis present

## 2020-01-29 DIAGNOSIS — U071 COVID-19: Secondary | ICD-10-CM | POA: Insufficient documentation

## 2020-01-30 ENCOUNTER — Emergency Department (HOSPITAL_COMMUNITY): Payer: BC Managed Care – PPO

## 2020-01-30 ENCOUNTER — Emergency Department (HOSPITAL_COMMUNITY)
Admission: EM | Admit: 2020-01-30 | Discharge: 2020-01-30 | Disposition: A | Discharge status: Home / Self Care | Payer: BC Managed Care – PPO | Attending: Emergency Medicine | Admitting: Emergency Medicine

## 2020-01-30 ENCOUNTER — Encounter (HOSPITAL_COMMUNITY): Payer: Self-pay | Admitting: Emergency Medicine

## 2020-01-30 DIAGNOSIS — Z20822 Contact with and (suspected) exposure to covid-19: Secondary | ICD-10-CM

## 2020-01-30 DIAGNOSIS — R430 Anosmia: Secondary | ICD-10-CM

## 2020-01-30 DIAGNOSIS — K529 Noninfective gastroenteritis and colitis, unspecified: Secondary | ICD-10-CM

## 2020-01-30 LAB — COMPREHENSIVE METABOLIC PANEL
ALT: 18 U/L (ref 0–44)
AST: 19 U/L (ref 15–41)
Albumin: 4.2 g/dL (ref 3.5–5.0)
Alkaline Phosphatase: 97 U/L (ref 38–126)
Anion gap: 11 (ref 5–15)
BUN: 9 mg/dL (ref 6–20)
CO2: 26 mmol/L (ref 22–32)
Calcium: 8.9 mg/dL (ref 8.9–10.3)
Chloride: 106 mmol/L (ref 98–111)
Creatinine, Ser: 0.81 mg/dL (ref 0.44–1.00)
GFR calc Af Amer: >60 mL/min (ref >60)
GFR calc non Af Amer: >60 mL/min (ref >60)
Glucose, Bld: 106 mg/dL — ABNORMAL HIGH (ref 70–99)
Potassium: 3.4 mmol/L — ABNORMAL LOW (ref 3.5–5.1)
Sodium: 143 mmol/L (ref 135–145)
Total Bilirubin: 0.7 mg/dL (ref 0.3–1.2)
Total Protein: 8.2 g/dL — ABNORMAL HIGH (ref 6.5–8.1)

## 2020-01-30 LAB — URINALYSIS, ROUTINE W REFLEX MICROSCOPIC
Bilirubin Urine: NEGATIVE
Glucose, UA: NEGATIVE mg/dL
Hgb urine dipstick: NEGATIVE
Ketones, ur: 80 mg/dL — AB
Leukocytes,Ua: NEGATIVE
Nitrite: NEGATIVE
Protein, ur: 30 mg/dL — AB
Specific Gravity, Urine: 1.028 (ref 1.005–1.030)
pH: 6 (ref 5.0–8.0)

## 2020-01-30 LAB — CBC
HCT: 45.4 % (ref 36.0–46.0)
Hemoglobin: 14.2 g/dL (ref 12.0–15.0)
MCH: 26.7 pg (ref 26.0–34.0)
MCHC: 31.3 g/dL (ref 30.0–36.0)
MCV: 85.3 fL (ref 80.0–100.0)
Platelets: 305 K/uL (ref 150–400)
RBC: 5.32 MIL/uL — ABNORMAL HIGH (ref 3.87–5.11)
RDW: 13.7 % (ref 11.5–15.5)
WBC: 8.3 K/uL (ref 4.0–10.5)
nRBC: 0 % (ref 0.0–0.2)

## 2020-01-30 LAB — I-STAT BETA HCG BLOOD, ED (MC, WL, AP ONLY): I-stat hCG, quantitative: <5 m[IU]/mL (ref <5)

## 2020-01-30 LAB — SARS CORONAVIRUS 2 (TAT 6-24 HRS): SARS Coronavirus 2: POSITIVE — AB

## 2020-01-30 LAB — LIPASE, BLOOD: Lipase: 26 U/L (ref 11–51)

## 2020-01-30 MED ORDER — ONDANSETRON HCL 4 MG/2ML IJ SOLN
4.0000 mg | Freq: Once | INTRAMUSCULAR | Status: AC
  Administered 2020-01-30: 02:00:00 4 mg via INTRAVENOUS
  Filled 2020-01-30: qty 2

## 2020-01-30 MED ORDER — SODIUM CHLORIDE (PF) 0.9 % IJ SOLN
INTRAMUSCULAR | Status: AC
  Filled 2020-01-30: qty 50

## 2020-01-30 MED ORDER — POTASSIUM CHLORIDE CRYS ER 20 MEQ PO TBCR
40.0000 meq | EXTENDED_RELEASE_TABLET | Freq: Once | ORAL | Status: AC
  Administered 2020-01-30: 02:00:00 40 meq via ORAL
  Filled 2020-01-30: qty 2

## 2020-01-30 MED ORDER — SODIUM CHLORIDE 0.9 % IV BOLUS
1000.0000 mL | Freq: Once | INTRAVENOUS | Status: AC
  Administered 2020-01-30: 02:00:00 1000 mL via INTRAVENOUS

## 2020-01-30 MED ORDER — IOHEXOL 300 MG/ML  SOLN
100.0000 mL | Freq: Once | INTRAMUSCULAR | Status: AC | PRN
  Administered 2020-01-30: 100 mL via INTRAVENOUS

## 2020-01-30 MED ORDER — SODIUM CHLORIDE 0.9% FLUSH
3.0000 mL | Freq: Once | INTRAVENOUS | Status: AC
  Administered 2020-01-30: 02:00:00 3 mL via INTRAVENOUS

## 2020-01-30 MED ORDER — CIPROFLOXACIN HCL 500 MG PO TABS: 500.0000 mg | ORAL_TABLET | Freq: Two times a day (BID) | ORAL | 0 refills | Status: DC

## 2020-01-30 MED ORDER — CIPROFLOXACIN HCL 500 MG PO TABS
500.0000 mg | ORAL_TABLET | Freq: Once | ORAL | Status: AC
  Administered 2020-01-30: 500 mg via ORAL
  Filled 2020-01-30: qty 1

## 2020-01-30 MED ORDER — METRONIDAZOLE 500 MG PO TABS: 500.0000 mg | ORAL_TABLET | Freq: Three times a day (TID) | ORAL | 0 refills | Status: DC

## 2020-01-30 MED ORDER — ONDANSETRON HCL 4 MG PO TABS: 4.0000 mg | ORAL_TABLET | Freq: Four times a day (QID) | ORAL | 0 refills | Status: DC | PRN

## 2020-01-30 MED ORDER — LOPERAMIDE HCL 2 MG PO CAPS: 2.0000 mg | ORAL_CAPSULE | Freq: Four times a day (QID) | ORAL | 0 refills | Status: DC | PRN

## 2020-01-30 MED ORDER — METRONIDAZOLE 500 MG PO TABS
500.0000 mg | ORAL_TABLET | Freq: Once | ORAL | Status: AC
  Administered 2020-01-30: 500 mg via ORAL
  Filled 2020-01-30: qty 1

## 2020-01-30 MED ORDER — LOPERAMIDE HCL 2 MG PO CAPS
4.0000 mg | ORAL_CAPSULE | Freq: Once | ORAL | Status: AC
  Administered 2020-01-30: 02:00:00 4 mg via ORAL
  Filled 2020-01-30: qty 2

## 2020-01-30 NOTE — Discharge Instructions (Signed)
Return if you start running a high fever, are vomiting, or if you develop severe abdominal pain.  Your COVID-19 test result should be available today. You can find that result on My Chart.

## 2020-01-30 NOTE — ED Triage Notes (Signed)
Patient here from home with complaints of abd pain below the belly button, diarrhea that started "a few weeks ago". Also reports that 2 days ago she loss her taste and smell.

## 2020-01-30 NOTE — ED Notes (Signed)
Patient attempted to provide stool sample but unable to at this time

## 2020-01-30 NOTE — ED Provider Notes (Signed)
[facility]EM[facility]ENCY [facility]PT Provider Note   CSN: [id number] Arrival date & time: 01/29/20  2325   History Chief Complaint  Patient presents with  . Diarrhea  . Abdominal Pain  . Loss of Taste/Smell    Alexandra Harris is a 29 y.o. female.  The history is provided by the patient.  Diarrhea Associated symptoms: abdominal pain   Abdominal Pain Associated symptoms: diarrhea   She has been having diarrhea since January 22.  She is having 4-5 watery bowel movements a day.  Prior to a bowel movement, she will get severe suprapubic cramping which resolves after bowel movement.  Symptoms tend to be better when she is laying down and she has not been awakened from sleep.  Bowel movements are watery without blood or mucus.  There has been some nausea with some intermittent vomiting.  She did go to an urgent care that diagnosed her with a stomach virus and advised brat diet.  She has stayed on the brat diet and taking Pepto-Bismol without any relief.  She denies fever, chills, sweats.  Yesterday, she lost her sense of smell and taste.  Today, she noted that her left arm was weak.  She denies any weakness in her leg.  She denies any numbness or tingling.  She denies exposure to COVID-19.  She does relate that she was on a 1 week course of antibiotics in December.  Past Medical History:  Diagnosis Date  . Depression     There are no problems to display for this patient.   History reviewed. No pertinent surgical history.   OB History   No obstetric history on file.     Family History  Problem Relation Age of Onset  . Diabetes Other   . Healthy Mother   . Diabetes Father     Social History   Tobacco Use  . Smoking status: Current Every Day Smoker    Packs/day: 0.20    Types: Cigarettes  . Smokeless tobacco: Never Used  Substance Use Topics  . Alcohol use: Yes    Comment: socially  . Drug use: No    Home Medications Prior to Admission medications     Medication Sig Start Date End Date Taking? Authorizing Provider  metFORMIN (GLUCOPHAGE) 500 MG tablet Take 1 tablet (500 mg total) by mouth 2 (two) times daily with a meal. 12/06/19 01/14/20  Lamptey, Britta Mccreedy, MD  norethindrone (MICRONOR,CAMILA,ERRIN) 0.35 MG tablet Take 1 tablet by mouth daily.  12/06/19  [provider]    Allergies    Patient has no known allergies.  Review of Systems   Review of Systems  Gastrointestinal: Positive for abdominal pain and diarrhea.  All other systems reviewed and are negative.   Physical Exam Updated Vital Signs BP 135/90 (BP Location: Left Arm)   Pulse 84   Temp 99 F (37.2 C) (Oral)   Resp 17   Ht 5' (1.524 m)   Wt 99.8 kg   SpO2 100%   BMI 42.97 kg/m   Physical Exam Vitals and nursing note reviewed.   29 year old female, resting comfortably and in no acute distress. Vital signs are normal. Oxygen saturation is 100%, which is normal. Head is normocephalic and atraumatic. PERRLA, EOMI. Oropharynx is clear. Neck is nontender and supple without adenopathy or JVD. Back is nontender and there is no CVA tenderness. Lungs are clear without rales, wheezes, or rhonchi. Chest is nontender. Heart has regular rate and rhythm without murmur. Abdomen is soft,  flat, nontender without masses or hepatosplenomegaly and peristalsis is normoactive. Extremities: There is mild tenderness to palpation in the left shoulder anterior deltoid groove.  There is pain on passive range of motion of the left shoulder with some rotator cuff impingement signs present.  Distal neurovascular exam is intact.  Full range of motion of all the joints without pain. Skin is warm and dry without rash. Neurologic: Mental status is normal, cranial nerves are intact, there are no motor or sensory deficits.  Motor strength is 5/5 in all muscle groups of the left arm, but shoulder abduction is weak secondary to pain.  There is no pronator drift.  ED Results / Procedures /  Treatments   Labs (all labs ordered are listed, but only abnormal results are displayed) Labs Reviewed  COMPREHENSIVE METABOLIC PANEL - Abnormal; Notable for the following components:      Result Value   Potassium 3.4 (*)    Glucose, Bld 106 (*)    Total Protein 8.2 (*)    All other components within normal limits  CBC - Abnormal; Notable for the following components:   RBC 5.32 (*)    All other components within normal limits  URINALYSIS, ROUTINE W REFLEX MICROSCOPIC - Abnormal; Notable for the following components:   APPearance CLOUDY (*)    Ketones, ur 80 (*)    Protein, ur 30 (*)    Bacteria, UA RARE (*)    All other components within normal limits  GI PATHOGEN PANEL BY PCR, STOOL  C DIFFICILE QUICK SCREEN W PCR REFLEX  SARS CORONAVIRUS 2 (TAT 6-24 HRS)  LIPASE, BLOOD  I-STAT BETA HCG BLOOD, ED ([facility], [facility], [facility] ONLY)   Procedures Procedures  Medications Ordered in ED Medications  sodium chloride flush (NS) 0.9 % injection 3 mL (has no administration in time range)    ED Course  I have reviewed the triage vital signs and the nursing notes.  Pertinent lab results that were available during my care of the patient were reviewed by me and considered in my medical decision making (see chart for details).  MDM Rules/Calculators/A&P Persistent nausea, vomiting, diarrhea.  With recent development of loss of smell and taste, possibility of COVID-19 infection is very high.  Old records are reviewed, and she was on a 1 week course of metronidazole for possible bacterial vaginosis, no other antibiotics prescribed recently.  With metronidazole is the only antibiotic, C. difficile is unlikely.  Will plan to send stool sample for enteric pathogen panel and C. difficile testing.  Arm weakness appears to be secondary to shoulder pain.  Will check x-ray, but I suspect rotator cuff syndrome.  Patient denies any recent trauma or overuse.    Labs are significant for mild hypokalemia.  She will be  given IV fluids, ondansetron, oral potassium and oral metoclopramide.  Shoulder x-ray shows slight elevation of the distal clavicle, but this is not an area with any tenderness and is felt to be a normal variant.  She feels somewhat better after above-noted treatment, but is very insistent that she wants to have a CT scan of her abdomen and pelvis because of her persistent abdominal pain.  I have explained to her that her normal labs and benign exam should be sufficient, but she is very insistent so CT scan is ordered.  CT scan is consistent with colitis, probably infectious.  She is given a dose of ciprofloxacin and metronidazole and sent home with prescriptions for both.  She is also given prescription for  ondansetron and loperamide.  Return precautions discussed.  Also given instructions regarding COVID-19 and told to check her Covid-19 test result on MyChart.  Fuller Song was evaluated in Emergency Department on 01/30/2020 for the symptoms described in the history of present illness. She was evaluated in the context of the global COVID-19 pandemic, which necessitated consideration that the patient might be at risk for infection with the SARS-CoV-2 virus that causes COVID-19. Institutional protocols and algorithms that pertain to the evaluation of patients at risk for COVID-19 are in a state of rapid change based on information released by regulatory bodies including the CDC and federal and state organizations. These policies and algorithms were followed during the patient's care in the ED.  Final Clinical Impression(s) / ED Diagnoses Final diagnoses:  None    Rx / DC Orders ED Discharge Orders    None       Dione Booze, MD 01/30/20 [phone]

## 2020-01-30 NOTE — ED Notes (Signed)
Pt provided labeled specimen cup for urine collection per MD order. ENMiles 

## 2020-02-20 ENCOUNTER — Encounter (HOSPITAL_COMMUNITY): Payer: Self-pay

## 2020-02-20 ENCOUNTER — Emergency Department (HOSPITAL_COMMUNITY)
Admission: EM | Admit: 2020-02-20 | Discharge: 2020-02-21 | Disposition: A | Discharge status: Home / Self Care | Payer: BC Managed Care – PPO | Attending: Emergency Medicine | Admitting: Emergency Medicine

## 2020-02-20 ENCOUNTER — Other Ambulatory Visit: Payer: Self-pay

## 2020-02-20 DIAGNOSIS — Z7984 Long term (current) use of oral hypoglycemic drugs: Secondary | ICD-10-CM | POA: Insufficient documentation

## 2020-02-20 DIAGNOSIS — R300 Dysuria: Secondary | ICD-10-CM | POA: Insufficient documentation

## 2020-02-20 DIAGNOSIS — F17210 Nicotine dependence, cigarettes, uncomplicated: Secondary | ICD-10-CM | POA: Diagnosis not present

## 2020-02-20 DIAGNOSIS — N898 Other specified noninflammatory disorders of vagina: Secondary | ICD-10-CM | POA: Diagnosis present

## 2020-02-20 DIAGNOSIS — Z79899 Other long term (current) drug therapy: Secondary | ICD-10-CM | POA: Insufficient documentation

## 2020-02-20 DIAGNOSIS — B372 Candidiasis of skin and nail: Secondary | ICD-10-CM | POA: Diagnosis not present

## 2020-02-20 LAB — WET PREP, GENITAL
Clue Cells Wet Prep HPF POC: NONE SEEN
Sperm: NONE SEEN
Trich, Wet Prep: NONE SEEN
Yeast Wet Prep HPF POC: NONE SEEN

## 2020-02-20 LAB — URINALYSIS, ROUTINE W REFLEX MICROSCOPIC
Bilirubin Urine: NEGATIVE
Glucose, UA: NEGATIVE mg/dL
Ketones, ur: 20 mg/dL — AB
Nitrite: NEGATIVE
Protein, ur: NEGATIVE mg/dL
RBC / HPF: >50 RBC/hpf — ABNORMAL HIGH (ref 0–5)
Specific Gravity, Urine: 1.026 (ref 1.005–1.030)
pH: 6 (ref 5.0–8.0)

## 2020-02-20 LAB — PREGNANCY, URINE: Preg Test, Ur: NEGATIVE

## 2020-02-20 MED ORDER — FLUCONAZOLE 150 MG PO TABS
150.0000 mg | ORAL_TABLET | Freq: Once | ORAL | Status: AC
  Administered 2020-02-20: 150 mg via ORAL
  Filled 2020-02-20: qty 1

## 2020-02-20 NOTE — ED Triage Notes (Signed)
Pt reports c/o vaginal pain, white discharge, pain with urination and red bumps around vagina. Pt reports having a new sex partner and would like to be checked for STDS. Pt reports this afternoon after voiding, when she wiped, she noticed blood on the toilet paper, however, is unsure when her last menstrual cycle was.

## 2020-02-20 NOTE — ED Notes (Signed)
Lab contacted to add on urine pregnancy °

## 2020-02-20 NOTE — ED Notes (Signed)
Pelvic setup at bedside.

## 2020-02-20 NOTE — ED Provider Notes (Signed)
[facility] DEPT Provider Note   CSN: [id number] Arrival date & time: 02/20/20  2103     History Chief Complaint  Patient presents with  . Vaginal Pain  . Dysuria    Alexandra Harris is a 29 y.o. female.  29 y/o female with hx of DM presents to the ED for c/o vaginal discharge and irritation. Patient states that she noticed some areas of redness to her external genitalia. This area burned when she was showering and using body wash on the area. Subsequently voided with some burning discomfort when this area was exposed to her urine. She has not has urinary frequency, urgency. Does report some white vaginal discharge that she noticed today. Has been sexually active with 1 new female partner in the past 30 days and expresses concern for possible STDs. Did test negative for STDs in December. Is currently on a course of Cipro/Flagyl for management of colitis. Has 4 more days of this abx left. No fevers. Menses began just PTA. No additional medications taken at home for symptoms.  The history is provided by the patient. No language interpreter was used.  Vaginal Pain  Dysuria      Past Medical History:  Diagnosis Date  . Depression     There are no problems to display for this patient.   History reviewed. No pertinent surgical history.   OB History   No obstetric history on file.     Family History  Problem Relation Age of Onset  . Diabetes Other   . Healthy Mother   . Diabetes Father     Social History   Tobacco Use  . Smoking status: Current Every Day Smoker    Packs/day: 0.20    Types: Cigarettes  . Smokeless tobacco: Never Used  Substance Use Topics  . Alcohol use: Yes    Comment: socially  . Drug use: No    Home Medications Prior to Admission medications   Medication Sig Start Date End Date Taking? Authorizing Provider  ciprofloxacin (CIPRO) 500 MG tablet Take 1 tablet (500 mg total) by mouth 2 (two) times daily. 7/89/38    Delora Fuel, MD  fluconazole (DIFLUCAN) 150 MG tablet Take 1 tablet (150 mg total) by mouth once for 1 dose. Use after completion of your antibiotics. 02/21/20 02/21/20  Antonietta Breach, PA-C  ibuprofen (ADVIL) 200 MG tablet Take 800 mg by mouth every 6 (six) hours as needed for moderate pain.    [provider]  loperamide (IMODIUM) 2 MG capsule Take 1 capsule (2 mg total) by mouth 4 (four) times daily as needed for diarrhea or loose stools. 12/20/73   Delora Fuel, MD  loratadine (CLARITIN) 10 MG tablet Take 10 mg by mouth daily.    [provider]  metFORMIN (GLUCOPHAGE) 500 MG tablet Take 1 tablet (500 mg total) by mouth 2 (two) times daily with a meal. 12/06/19 01/30/20  Lamptey, Myrene Galas, MD  metroNIDAZOLE (FLAGYL) 500 MG tablet Take 1 tablet (500 mg total) by mouth 3 (three) times daily. 12/21/56   Delora Fuel, MD  nystatin (MYCOSTATIN/NYSTOP) powder Apply 1 application topically 3 (three) times daily. 02/21/20   Antonietta Breach, PA-C  ondansetron (ZOFRAN) 4 MG tablet Take 1 tablet (4 mg total) by mouth every 6 (six) hours as needed for nausea. 05/15/77   Delora Fuel, MD  norethindrone (MICRONOR,CAMILA,ERRIN) 0.35 MG tablet Take 1 tablet by mouth daily.  12/06/19  [provider]    Allergies    Patient  has no known allergies.  Review of Systems   Review of Systems  Genitourinary: Positive for dysuria and vaginal pain.  Ten systems reviewed and are negative for acute change, except as noted in the HPI.    Physical Exam Updated Vital Signs BP 127/89 (BP Location: Right Arm)   Pulse 88   Temp 98.9 F (37.2 C) (Oral)   Resp 16   Ht 5' (1.524 m)   Wt 95.3 kg   SpO2 100%   BMI 41.01 kg/m   Physical Exam Vitals and nursing note reviewed.  Constitutional:      General: She is not in acute distress.    Appearance: She is well-developed. She is not diaphoretic.     Comments: Obese AA female  HENT:     Head: Normocephalic and atraumatic.  Eyes:     General: No  scleral icterus.    Conjunctiva/sclera: Conjunctivae normal.  Pulmonary:     Effort: Pulmonary effort is normal. No respiratory distress.     Comments: Respirations even and unlabored Genitourinary:    Comments: Very scant areas of irritation and desquamation of skin of perineal region. No vesicles, papules, lesions. Scant pale white, creamy vaginal discharge mixed with blood from menses. No clots in vaginal vault. Musculoskeletal:        General: Normal range of motion.     Cervical back: Normal range of motion.  Skin:    General: Skin is warm and dry.     Coloration: Skin is not pale.     Findings: No erythema or rash.  Neurological:     Mental Status: She is alert and oriented to person, place, and time.     Coordination: Coordination normal.  Psychiatric:        Behavior: Behavior normal.     ED Results / Procedures / Treatments   Labs (all labs ordered are listed, but only abnormal results are displayed) Labs Reviewed  WET PREP, GENITAL - Abnormal; Notable for the following components:      Result Value   WBC, Wet Prep HPF POC FEW (*)    All other components within normal limits  URINALYSIS, ROUTINE W REFLEX MICROSCOPIC - Abnormal; Notable for the following components:   APPearance HAZY (*)    Hgb urine dipstick LARGE (*)    Ketones, ur 20 (*)    Leukocytes,Ua SMALL (*)    RBC / HPF >50 (*)    Bacteria, UA RARE (*)    All other components within normal limits  PREGNANCY, URINE  RPR  HIV ANTIBODY (ROUTINE TESTING W REFLEX)  GC/CHLAMYDIA PROBE AMP ([place]) NOT AT [facility]    EKG None  Radiology No results found.  Procedures Procedures (including critical care time)  Medications Ordered in ED Medications  fluconazole (DIFLUCAN) tablet 150 mg (150 mg Oral Given 02/20/20 2338)    ED Course  I have reviewed the triage vital signs and the nursing notes.  Pertinent labs & imaging results that were available during my care of the patient were reviewed by me and  considered in my medical decision making (see chart for details).  Clinical Course as of Feb 21 112  Wed Feb 20, 2020  2341 UA findings c/w contamination. Hematuria likely secondary to onset of menses. No bacteriuria or pyuria.   [KH]    Clinical Course User Index [KH] Antony Madura, PA-C   MDM Rules/Calculators/A&P  29 year old female presenting for a rash in her perineal region into the external genitalia.  Her physical exam findings are consistent with likely candidal dermatitis precipitated by persistent use of antibiotic.  Her exam findings are inconsistent with genital herpes.  While she endorses a female sexual partner, her work-up is not concerning for STIs.  Patient has STD test with the health department in 48 hours.  No evidence of urinary tract infection on urinalysis.  Patient given a tablet of antibiotic discharge as well as a prescription for Diflucan to take x1 following completion of her antibiotics.  Will continue with topical nystatin powder.  Return precautions discussed and provided. Patient discharged in stable condition with no unaddressed concerns.   Final Clinical Impression(s) / ED Diagnoses Final diagnoses:  Candidal dermatitis    Rx / DC Orders ED Discharge Orders         Ordered    nystatin (MYCOSTATIN/NYSTOP) powder  3 times daily     02/21/20 0042    fluconazole (DIFLUCAN) 150 MG tablet   Once     02/21/20 0042           Antony Madura, PA-C 02/21/20 0115    Long, Arlyss Repress, MD 02/25/20 [phone]

## 2020-02-21 LAB — HIV ANTIBODY (ROUTINE TESTING W REFLEX): HIV Screen 4th Generation wRfx: NONREACTIVE

## 2020-02-21 LAB — RPR: RPR Ser Ql: NONREACTIVE

## 2020-02-21 MED ORDER — FLUCONAZOLE 150 MG PO TABS: 150.0000 mg | ORAL_TABLET | Freq: Once | ORAL | 0 refills | Status: AC

## 2020-02-21 MED ORDER — NYSTATIN 100000 UNIT/GM EX POWD: 1.0000 "application " | Freq: Three times a day (TID) | CUTANEOUS | 0 refills | Status: DC

## 2020-02-21 NOTE — Discharge Instructions (Addendum)
Use topical nystatin powder as prescribed and take Diflucan after completion of your antibiotics. Keep your skin clean and dry. Follow up with a primary care doctor.  You should also follow-up with the health department in 48 hours for the results of your STD tests. If you test positive for STDs, notify all sexual partners of their need to be tested and treated as well. Use a condom when sexually active.

## 2020-02-22 LAB — GC/CHLAMYDIA PROBE AMP (~~LOC~~) NOT AT ARMC
Chlamydia: NEGATIVE
Neisseria Gonorrhea: NEGATIVE

## 2021-01-27 ENCOUNTER — Ambulatory Visit
Admission: RE | Admit: 2021-01-27 | Discharge: 2021-01-27 | Disposition: A | Discharge status: Home / Self Care | Payer: BC Managed Care – PPO | Source: Ambulatory Visit | Attending: Internal Medicine | Admitting: Internal Medicine

## 2021-01-27 ENCOUNTER — Other Ambulatory Visit: Payer: Self-pay

## 2021-01-27 VITALS — BP 152/61 | Temp 98.7°F | Resp 18

## 2021-01-27 DIAGNOSIS — N760 Acute vaginitis: Secondary | ICD-10-CM | POA: Insufficient documentation

## 2021-01-27 DIAGNOSIS — Z113 Encounter for screening for infections with a predominantly sexual mode of transmission: Secondary | ICD-10-CM | POA: Diagnosis present

## 2021-01-27 HISTORY — DX: Type 2 diabetes mellitus without complications: E11.9

## 2021-01-27 MED ORDER — FLUCONAZOLE 150 MG PO TABS: 150.0000 mg | ORAL_TABLET | Freq: Every day | ORAL | 0 refills | Status: DC

## 2021-01-27 NOTE — Discharge Instructions (Addendum)
We will call you if any of your test are positive  

## 2021-01-27 NOTE — ED Triage Notes (Addendum)
Pt c/o vaginal irritation, burning, and grayish discharge x3 days. Pt requesting for full STD testing.

## 2021-01-27 NOTE — ED Provider Notes (Signed)
[facility]    CSN: [id number] Arrival date & time: 01/27/21  1754      History   Chief Complaint Chief Complaint  Patient presents with  . SEXUALLY TRANSMITTED DISEASE  . appt 6    HPI Alexandra Harris is a 30 y.o. female who wants STD testing. Has some vaginal irritation and mild itching on vaginal area. Denies fishy odor but has discharge. A female partner gave her oral sex. Pt is prone to having yeast infections and itches this way, but has not had the type of discharge she is having right now. She denies use of sexual toys with female partner.   Past Medical History:  Diagnosis Date  . Depression   . Diabetes mellitus without complication (HCC)     There are no problems to display for this patient.   History reviewed. No pertinent surgical history.  OB History   No obstetric history on file.      Home Medications    Prior to Admission medications   Medication Sig Start Date End Date Taking? Authorizing Provider  metFORMIN (GLUCOPHAGE) 500 MG tablet Take 1 tablet (500 mg total) by mouth 2 (two) times daily with a meal. 12/06/19 01/30/20  Lamptey, Britta Mccreedy, MD  norethindrone (MICRONOR,CAMILA,ERRIN) 0.35 MG tablet Take 1 tablet by mouth daily.  12/06/19  [provider]    Family History Family History  Problem Relation Age of Onset  . Diabetes Other   . Healthy Mother   . Diabetes Father     Social History Social History   Tobacco Use  . Smoking status: Current Every Day Smoker    Packs/day: 0.20    Types: Cigarettes  . Smokeless tobacco: Never Used  Vaping Use  . Vaping Use: Never used  Substance Use Topics  . Alcohol use: Yes    Comment: socially  . Drug use: No     Allergies   Patient has no known allergies.   Review of Systems Review of Systems  Constitutional: Negative for fever.  Genitourinary: Positive for vaginal discharge. Negative for dysuria, genital sores, pelvic pain and vaginal pain.       + vaginal  itching   Skin: Negative for rash.  Hematological: Negative for adenopathy.     Physical Exam Triage Vital Signs ED Triage Vitals  Enc Vitals Group     BP 01/27/21 1813 (!) 152/61     Pulse --      Resp 01/27/21 1813 18     Temp 01/27/21 1813 98.7 F (37.1 C)     Temp Source 01/27/21 1813 Oral     SpO2 01/27/21 1813 99 %     Weight --      Height --      Head Circumference --      Peak Flow --      Pain Score 01/27/21 1814 0     Pain Loc --      Pain Edu? --      Excl. in [place]? --    No data found.  Updated Vital Signs BP (!) 152/61 (BP Location: Left Arm)   Temp 98.7 F (37.1 C) (Oral)   Resp 18   LMP 01/20/2021   SpO2 99%   Visual Acuity Right Eye Distance:   Left Eye Distance:   Bilateral Distance:    Right Eye Near:   Left Eye Near:    Bilateral Near:     Physical Exam Vitals and nursing note reviewed.  Constitutional:  General: She is not in acute distress.    Appearance: She is obese. She is not toxic-appearing.  HENT:     Right Ear: External ear normal.     Left Ear: External ear normal.  Eyes:     General: No scleral icterus.    Conjunctiva/sclera: Conjunctivae normal.  Pulmonary:     Effort: Pulmonary effort is normal.  Musculoskeletal:        General: Normal range of motion.     Cervical back: Neck supple.  Neurological:     Mental Status: She is alert and oriented to person, place, and time.     Gait: Gait normal.  Psychiatric:        Mood and Affect: Mood normal.        Behavior: Behavior normal.        Thought Content: Thought content normal.        Judgment: Judgment normal.      UC Treatments / Results  Labs (all labs ordered are listed, but only abnormal results are displayed) Labs Reviewed  RPR  HIV ANTIBODY (ROUTINE TESTING W REFLEX)  GC/CHLAMYDIA PROBE AMP ([place]) NOT AT [facility]  CERVICOVAGINAL ANCILLARY ONLY    EKG   Radiology No results found.  Procedures Procedures (including critical care  time)  Medications Ordered in UC Medications - No data to display  Initial Impression / Assessment and Plan / UC Course  I have reviewed the triage vital signs and the nursing notes. STD test are pending. IN the mean time I sent Diflucan since it feels like yeast infection to her   Final Clinical Impressions(s) / UC Diagnoses   Final diagnoses:  Screen for STD (sexually transmitted disease)   Discharge Instructions   None    ED Prescriptions    None     PDMP not reviewed this encounter.   Garey Ham, [place] 01/27/21 1840

## 2021-01-28 LAB — RPR: RPR Ser Ql: NONREACTIVE

## 2021-01-29 ENCOUNTER — Telehealth (HOSPITAL_COMMUNITY): Payer: Self-pay | Admitting: Emergency Medicine

## 2021-01-29 LAB — CERVICOVAGINAL ANCILLARY ONLY
Bacterial Vaginitis (gardnerella): POSITIVE — AB
Candida Glabrata: NEGATIVE
Candida Vaginitis: NEGATIVE
Chlamydia: NEGATIVE
Comment: NEGATIVE
Comment: NEGATIVE
Comment: NEGATIVE
Comment: NEGATIVE
Comment: NEGATIVE
Comment: NORMAL
Neisseria Gonorrhea: NEGATIVE
Trichomonas: NEGATIVE

## 2021-01-29 MED ORDER — METRONIDAZOLE 500 MG PO TABS: 500.0000 mg | ORAL_TABLET | Freq: Two times a day (BID) | ORAL | 0 refills | Status: DC

## 2021-08-10 IMAGING — CT CT ABD-PELV W/ CM
2 of 4 series · 16 of 46 positions shown, 18 images · IV contrast (OMNIPAQUE 300)
Comparison: None.

CLINICAL DATA: Lower abdominal pain, diarrhea for several weeks

EXAM:
CT ABDOMEN AND PELVIS WITH CONTRAST
TECHNIQUE: Multidetector CT imaging of the abdomen and pelvis was performed
using the standard protocol following bolus administration of
intravenous contrast.
CONTRAST:  100mL OMNIPAQUE IOHEXOL 300 MG/ML  SOLN

[Series 2: axial st · axial · 0.68mm/px · z∈[+1041,+1411]mm · 13 of 84 slices shown, 15 images]
[im 5/84  soft-tissue]
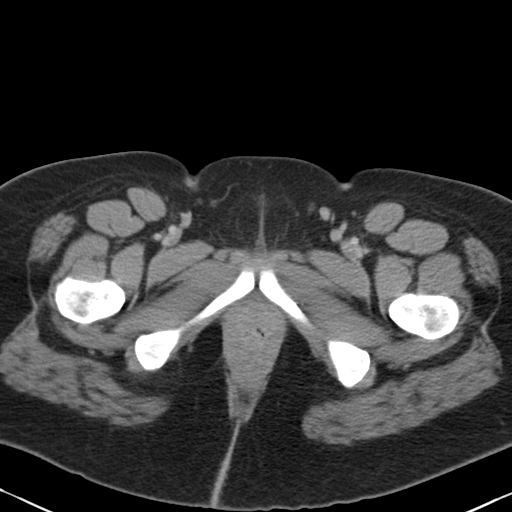
[im 5/84  bone]
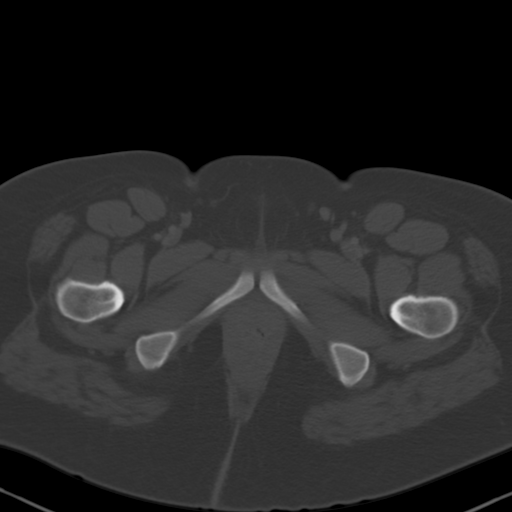
[im 10/84  soft-tissue]
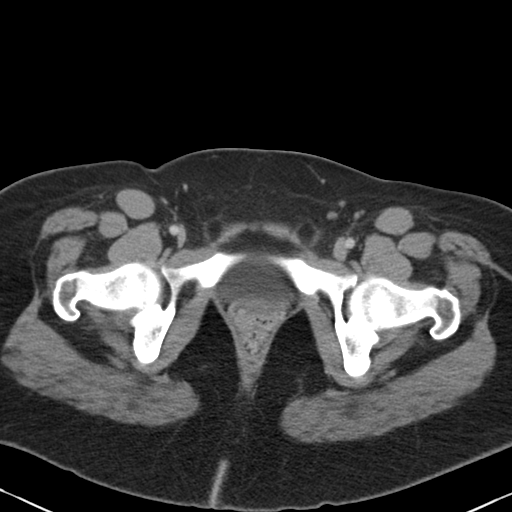
[im 19/84  soft-tissue]
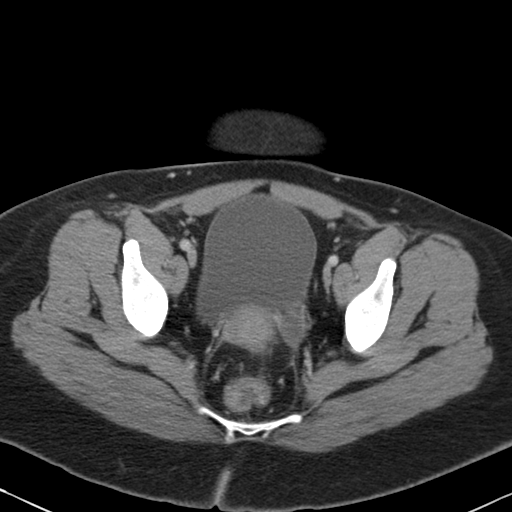
[im 24/84  soft-tissue]
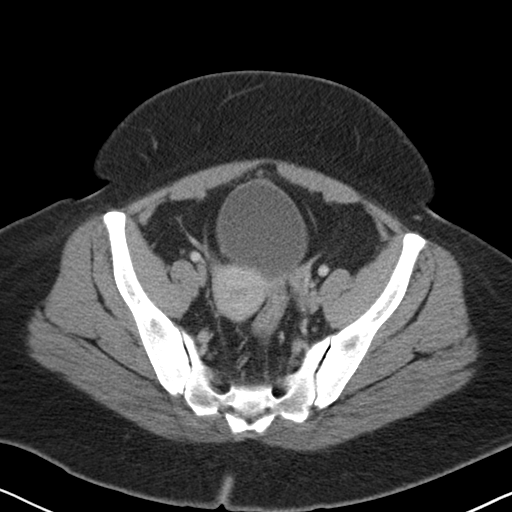
[im 28/84  soft-tissue]
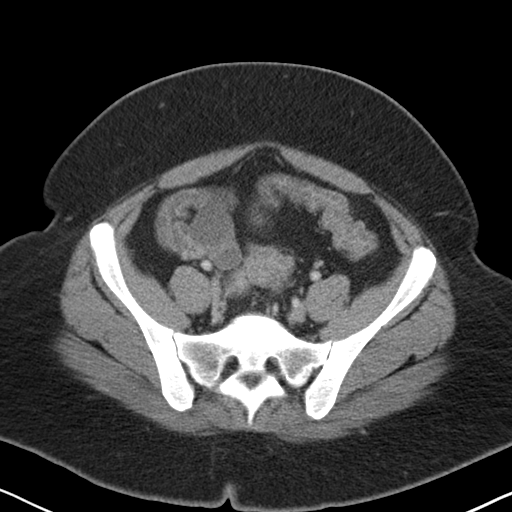
[im 37/84  soft-tissue]
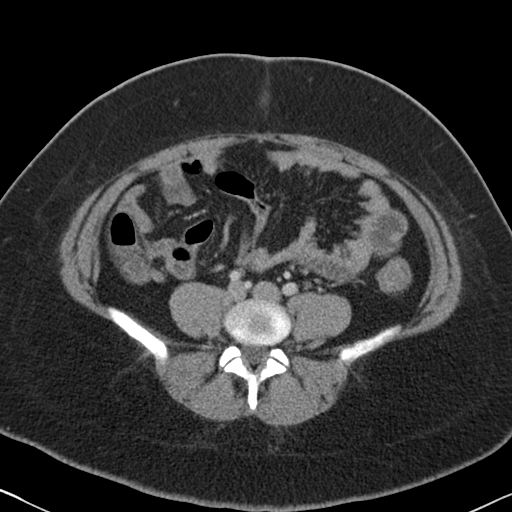
[im 42/84  soft-tissue]
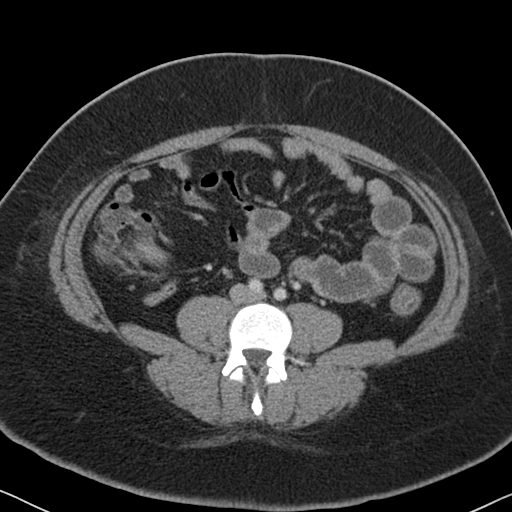
[im 47/84  soft-tissue]
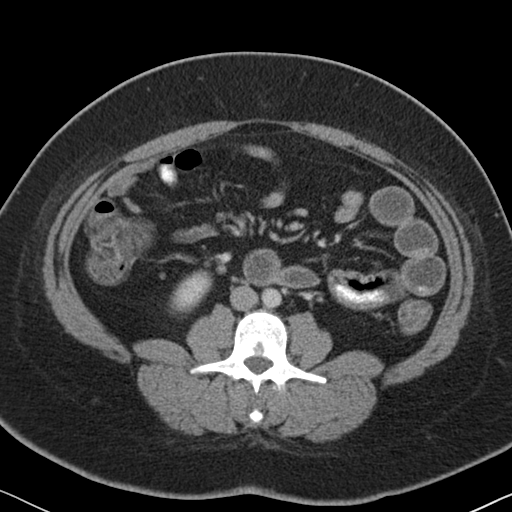
[im 56/84  soft-tissue]
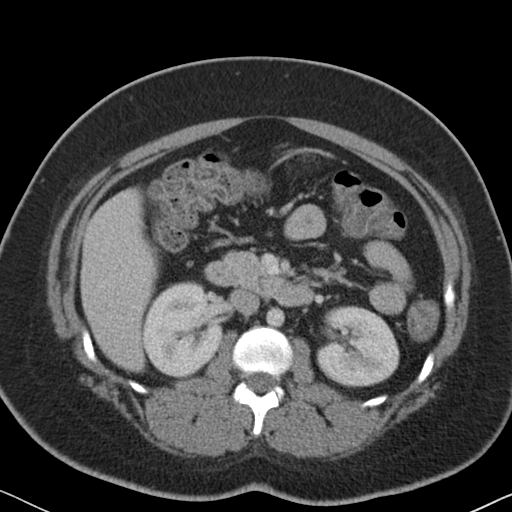
[im 56/84  bone]
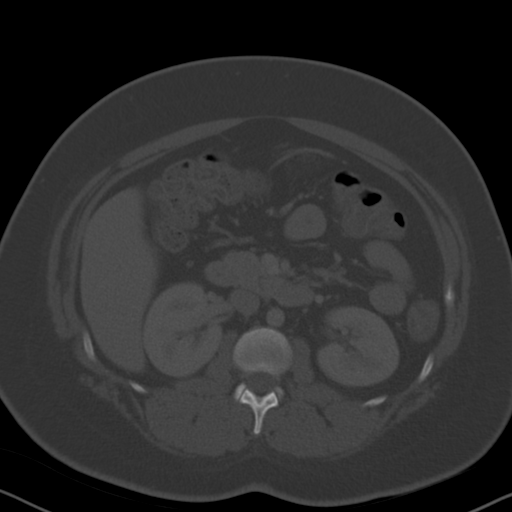
[im 60/84  soft-tissue]
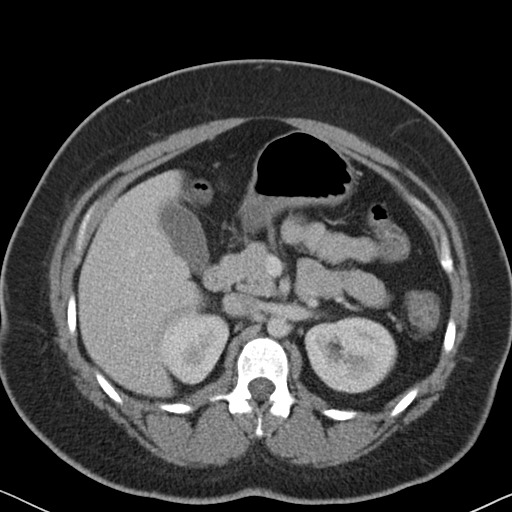
[im 65/84  soft-tissue]
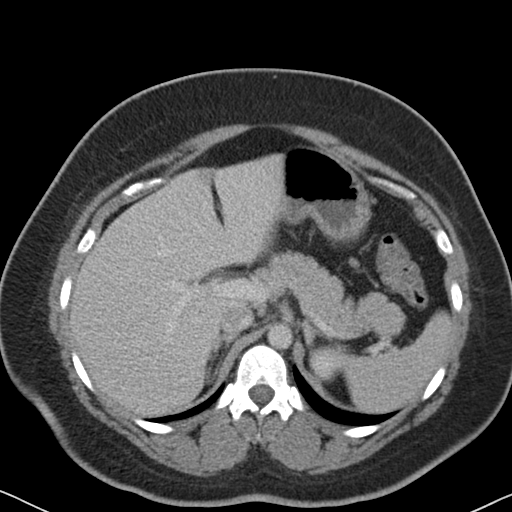
[im 74/84  soft-tissue]
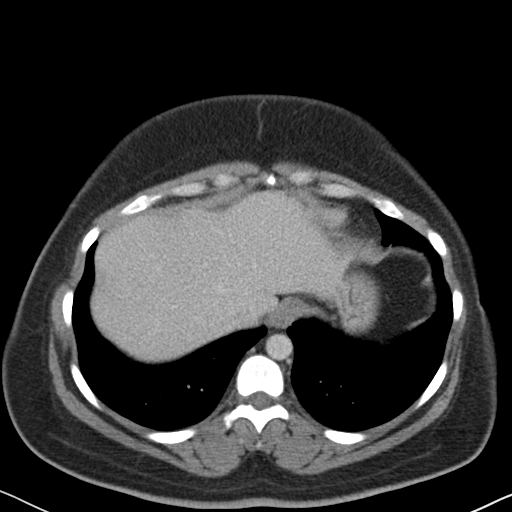
[im 79/84  soft-tissue]
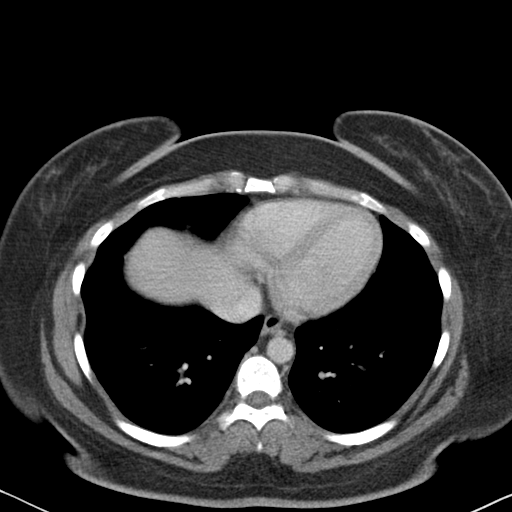

[Series 4: coronal st · coronal · 0.75mm/px · 3 of 140 slices shown]
[im 47/140  soft-tissue]
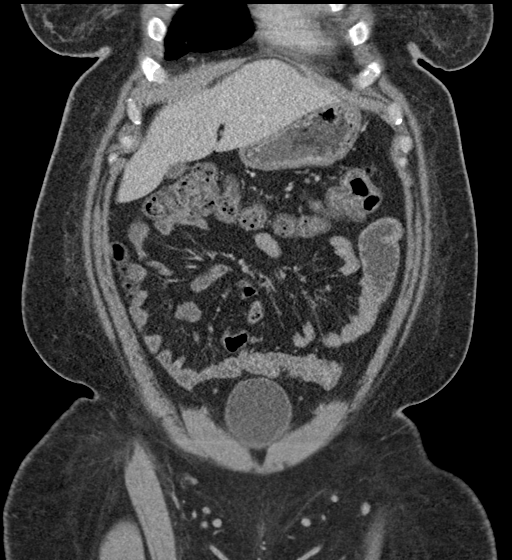
[im 62/140  soft-tissue]
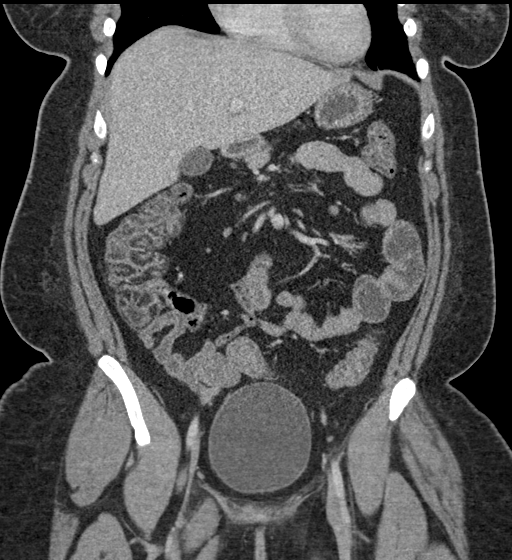
[im 78/140  soft-tissue]
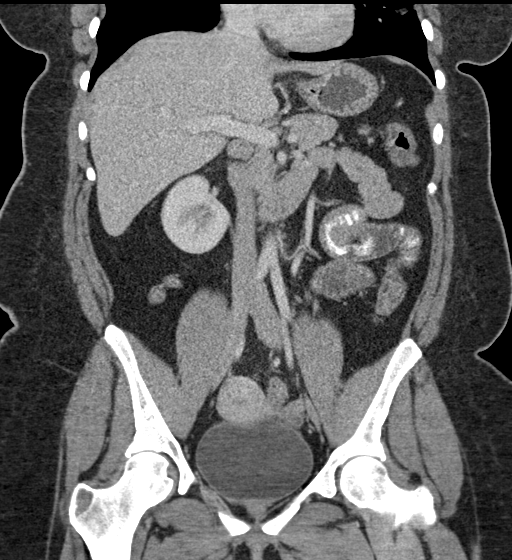

[16 of 46 positions shown; findings below may reference images not displayed]

FINDINGS: Lower chest: Lung bases are clear. Normal heart size. No pericardial
effusion.

Hepatobiliary: No focal liver abnormality is seen. No gallstones,
gallbladder wall thickening, or biliary dilatation.

Pancreas: Unremarkable. No pancreatic ductal dilatation or
surrounding inflammatory changes.

Spleen: Normal in size without focal abnormality.

Adrenals/Urinary Tract: Adrenal glands are unremarkable. Kidneys are
normal, without renal calculi, focal lesion, or hydronephrosis.
Bladder is unremarkable.

Stomach/Bowel: Distal esophagus, stomach and duodenal sweep are
unremarkable. Few fluid-filled loops of small bowel in the mid
abdomen are nonspecific. No frank small bowel dilatation or wall
thickening. A normal appendix is visualized. Diffuse edematous
pancolonic mural thickening is noted with some faint: Like stranding
in the adjacent mesentery with few reactive lymph nodes. No
extraluminal fluid, gas, organized collection or abscess formation.

Vascular/Lymphatic: The aorta is normal caliber. Major venous
structures are unremarkable. Reactive adenopathy in the mesentery,
as detailed above. No pathologically enlarged abdominopelvic nodes.

Reproductive: Anteverted uterus. Fluid attenuation cyst in the lower
left adnexa, likely dominant follicle.

Other: No abdominopelvic free fluid or free gas. No bowel containing
hernias.

Musculoskeletal: No acute osseous abnormality or suspicious osseous
lesion.
IMPRESSION: Diffuse edematous pancolonic mural thickening is compatible with an
infectious or inflammatory colitis.

## 2022-02-06 ENCOUNTER — Encounter (HOSPITAL_COMMUNITY): Payer: Self-pay | Admitting: Emergency Medicine

## 2022-02-06 ENCOUNTER — Ambulatory Visit (HOSPITAL_COMMUNITY)
Admission: EM | Admit: 2022-02-06 | Discharge: 2022-02-06 | Disposition: A | Discharge status: Home / Self Care | Payer: BC Managed Care – PPO | Attending: Urgent Care | Admitting: Urgent Care

## 2022-02-06 ENCOUNTER — Other Ambulatory Visit: Payer: Self-pay

## 2022-02-06 DIAGNOSIS — N898 Other specified noninflammatory disorders of vagina: Secondary | ICD-10-CM

## 2022-02-06 LAB — POC URINE PREG, ED: Preg Test, Ur: NEGATIVE

## 2022-02-06 LAB — POCT URINALYSIS DIPSTICK, ED / UC
Glucose, UA: 100 mg/dL — AB
Ketones, ur: 15 mg/dL — AB
Leukocytes,Ua: NEGATIVE
Nitrite: NEGATIVE
Protein, ur: NEGATIVE mg/dL
Specific Gravity, Urine: >=1.03 (ref 1.005–1.030)
Urobilinogen, UA: 1 mg/dL (ref 0.0–1.0)
pH: 5.5 (ref 5.0–8.0)

## 2022-02-06 MED ORDER — TINIDAZOLE 500 MG PO TABS: 2.0000 g | ORAL_TABLET | Freq: Once | ORAL | 0 refills | Status: AC

## 2022-02-06 NOTE — ED Triage Notes (Signed)
Patient c/o vaginal discharge x 1.5 weeks.   Patient endorses odor. Patient endorses "gray" discharge.   Patient endorses itchiness. Patient endorses a new partner.   Patient denies N/V or ABD pain.   Patient hasn't used any mediations for symptoms.

## 2022-02-06 NOTE — ED Provider Notes (Signed)
[facility]    CSN: [id number] Arrival date & time: 02/06/22  1735      History   Chief Complaint Chief Complaint  Patient presents with   Vaginal Discharge    HPI Alexandra Harris is a 31 y.o. female.   Patient presents today with concerns of vaginal discharge.  She states it started initially 1-1/2 weeks ago, but then her cycle came on.  She states the symptoms resolved spontaneously without any treatment during her cycle, but then started again this morning.  She denies any pelvic pain or hematuria.  She denies any urinary symptoms.  She does have a history of diabetes and states that she has had a history of frequent yeast infections in the past.  She states there is some itching, but it is not intensely pruritic.  She is no longer taking any medications for her diabetes.  She states she has a new sexual partner and wanted to be tested for STIs.  She states there is a smell to the discharge "that I cannot describe", and states she has never smelled something like it.  No fever, nausea, vomiting.  No rash.   Vaginal Discharge  Past Medical History:  Diagnosis Date   Depression    Diabetes mellitus without complication ([place])     There are no problems to display for this patient.   History reviewed. No pertinent surgical history.  OB History   No obstetric history on file.      Home Medications    Prior to Admission medications   Medication Sig Start Date End Date Taking? Authorizing Provider  tinidazole (TINDAMAX) 500 MG tablet Take 4 tablets (2,000 mg total) by mouth once for 1 dose. 02/06/22 02/06/22 Yes Divya Munshi L, PA  metFORMIN (GLUCOPHAGE) 500 MG tablet Take 1 tablet (500 mg total) by mouth 2 (two) times daily with a meal. 12/06/19 01/30/20  Lamptey, Myrene Galas, MD  norethindrone (MICRONOR,CAMILA,ERRIN) 0.35 MG tablet Take 1 tablet by mouth daily.  12/06/19  [provider]    Family History Family History  Problem Relation Age of  Onset   Diabetes Other    Healthy Mother    Diabetes Father     Social History Social History   Tobacco Use   Smoking status: Every Day    Packs/day: 0.20    Types: Cigarettes   Smokeless tobacco: Never  Vaping Use   Vaping Use: Never used  Substance Use Topics   Alcohol use: Yes    Comment: socially   Drug use: No     Allergies   Patient has no known allergies.   Review of Systems Review of Systems  Genitourinary:  Positive for vaginal discharge.  All other systems reviewed and are negative.   Physical Exam Triage Vital Signs ED Triage Vitals  Enc Vitals Group     BP 02/06/22 1815 121/87     Pulse Rate 02/06/22 1815 71     Resp 02/06/22 1815 16     Temp 02/06/22 1815 98.6 F (37 C)     Temp Source 02/06/22 1815 Oral     SpO2 02/06/22 1815 99 %     Weight --      Height --      Head Circumference --      Peak Flow --      Pain Score 02/06/22 1819 0     Pain Loc --      Pain Edu? --      Excl.  in [place]? --    No data found.  Updated Vital Signs BP 121/87 (BP Location: Left Arm)    Pulse 71    Temp 98.6 F (37 C) (Oral)    Resp 16    LMP 02/02/2022 (Exact Date)    SpO2 99%   Visual Acuity Right Eye Distance:   Left Eye Distance:   Bilateral Distance:    Right Eye Near:   Left Eye Near:    Bilateral Near:     Physical Exam Vitals and nursing note reviewed.  Constitutional:      General: She is not in acute distress.    Appearance: Normal appearance. She is well-developed. She is obese. She is not ill-appearing, toxic-appearing or diaphoretic.  HENT:     Head: Normocephalic and atraumatic.  Eyes:     Conjunctiva/sclera: Conjunctivae normal.  Cardiovascular:     Rate and Rhythm: Normal rate and regular rhythm.  Pulmonary:     Effort: Pulmonary effort is normal. No respiratory distress.     Breath sounds: Normal breath sounds.  Abdominal:     General: Abdomen is flat. There is no distension.     Palpations: Abdomen is soft.     Tenderness:  There is no abdominal tenderness.  Genitourinary:    Comments: Pt refused exam Musculoskeletal:        General: No swelling.     Cervical back: Neck supple.  Skin:    General: Skin is warm and dry.     Capillary Refill: Capillary refill takes less than 2 seconds.  Neurological:     Mental Status: She is alert.  Psychiatric:        Mood and Affect: Mood normal.     UC Treatments / Results  Labs (all labs ordered are listed, but only abnormal results are displayed) Labs Reviewed  POCT URINALYSIS DIPSTICK, ED / UC - Abnormal; Notable for the following components:      Result Value   Glucose, UA 100 (*)    Bilirubin Urine SMALL (*)    Ketones, ur 15 (*)    Hgb urine dipstick TRACE (*)    All other components within normal limits  POC URINE PREG, ED  CERVICOVAGINAL ANCILLARY ONLY    EKG   Radiology No results found.  Procedures Procedures (including critical care time)  Medications Ordered in UC Medications - No data to display  Initial Impression / Assessment and Plan / UC Course  I have reviewed the triage vital signs and the nursing notes.  Pertinent labs & imaging results that were available during my care of the patient were reviewed by me and considered in my medical decision making (see chart for details).     Vaginal discharge - pt has hx of BV in the past, will cover for this due to known hx. Tx will also cover for possible trich. Uncontrolled DM - pt has hx of yeast infections in past, is deferring exam. Will await test results prior to restarting fluconazole, may try OTC monistat.  Final Clinical Impressions(s) / UC Diagnoses   Final diagnoses:  Vaginal discharge     Discharge Instructions      You were tested today for gonorrhea, chlamydia, trichomonas, BV, and yeast. Given your history of BV, will cover for this with single dose tinidazole.  We will call you with the results of your test once received. Please avoid all forms of intercourse  until test results received, and if positive for any STI, all partners will  need to complete entire course of antibiotics prior to resuming. As always, practice safer sexual practices by using protection each and every time, and limiting number of partners.     ED Prescriptions     Medication Sig Dispense Auth. Provider   tinidazole (TINDAMAX) 500 MG tablet Take 4 tablets (2,000 mg total) by mouth once for 1 dose. 4 tablet Evadean Sproule L, [place]      PDMP not reviewed this encounter.   Chaney Malling, [place] 02/06/22 1914

## 2022-02-06 NOTE — Discharge Instructions (Addendum)
You were tested today for gonorrhea, chlamydia, trichomonas, BV, and yeast. Given your history of BV, will cover for this with single dose tinidazole.  We will call you with the results of your test once received. Please avoid all forms of intercourse until test results received, and if positive for any STI, all partners will need to complete entire course of antibiotics prior to resuming. As always, practice safer sexual practices by using protection each and every time, and limiting number of partners.

## 2022-02-09 LAB — CERVICOVAGINAL ANCILLARY ONLY
Bacterial Vaginitis (gardnerella): POSITIVE — AB
Candida Glabrata: NEGATIVE
Candida Vaginitis: NEGATIVE
Chlamydia: NEGATIVE
Comment: NEGATIVE
Comment: NEGATIVE
Comment: NEGATIVE
Comment: NEGATIVE
Comment: NEGATIVE
Comment: NORMAL
Neisseria Gonorrhea: NEGATIVE
Trichomonas: NEGATIVE

## 2022-07-13 ENCOUNTER — Ambulatory Visit (HOSPITAL_COMMUNITY)
Admission: EM | Admit: 2022-07-13 | Discharge: 2022-07-13 | Disposition: A | Discharge status: Home / Self Care | Payer: BC Managed Care – PPO | Attending: Physician Assistant | Admitting: Physician Assistant

## 2022-07-13 ENCOUNTER — Encounter (HOSPITAL_COMMUNITY): Payer: Self-pay | Admitting: *Deleted

## 2022-07-13 DIAGNOSIS — N760 Acute vaginitis: Secondary | ICD-10-CM | POA: Diagnosis not present

## 2022-07-13 DIAGNOSIS — E119 Type 2 diabetes mellitus without complications: Secondary | ICD-10-CM | POA: Diagnosis not present

## 2022-07-13 DIAGNOSIS — E11628 Type 2 diabetes mellitus with other skin complications: Secondary | ICD-10-CM

## 2022-07-13 DIAGNOSIS — N898 Other specified noninflammatory disorders of vagina: Secondary | ICD-10-CM | POA: Insufficient documentation

## 2022-07-13 DIAGNOSIS — B354 Tinea corporis: Secondary | ICD-10-CM | POA: Insufficient documentation

## 2022-07-13 DIAGNOSIS — B356 Tinea cruris: Secondary | ICD-10-CM | POA: Insufficient documentation

## 2022-07-13 LAB — CBG MONITORING, ED: Glucose-Capillary: 91 mg/dL (ref 70–99)

## 2022-07-13 MED ORDER — CLOTRIMAZOLE-BETAMETHASONE 1-0.05 % EX CREA: TOPICAL_CREAM | CUTANEOUS | 0 refills | Status: AC

## 2022-07-13 MED ORDER — MICONAZOLE NITRATE 2 % EX CREA: 1.0000 | TOPICAL_CREAM | Freq: Two times a day (BID) | CUTANEOUS | 0 refills | Status: AC

## 2022-07-13 MED ORDER — METRONIDAZOLE 500 MG PO TABS: 500.0000 mg | ORAL_TABLET | Freq: Two times a day (BID) | ORAL | 0 refills | Status: DC

## 2022-07-13 NOTE — ED Provider Notes (Signed)
[facility]    CSN: [id number] Arrival date & time: 07/13/22  1835      History   Chief Complaint Chief Complaint  Patient presents with   Vaginal Discharge   Skin Lesion    HPI Alexandra Harris is a 31 y.o. female.   Patient presents today with several concerns.  Her primary concern today is a several week history of pruritic annular lesion on her left scalp.  Reports that this began after she went for haircut.  This has not spread or expanded but has become more bothersome.  She denies any changes to personal hygiene products including soaps, detergents, shampoo.  She denies any exposure to plants or insects.  Denies history of eczema or other dermatological condition.  She has a similar pruritic area in her groin.  She has not noticed a significant rash in this area but does report that it is bothersome.  She denies history of diabetes but is not currently followed by primary care provider.  She is open to establishing with someone.  She does not monitor her blood sugar regularly.    Past Medical History:  Diagnosis Date   Diabetes mellitus without complication (HCC)     There are no problems to display for this patient.   History reviewed. No pertinent surgical history.  OB History   No obstetric history on file.      Home Medications    Prior to Admission medications   Medication Sig Start Date End Date Taking? Authorizing Provider  clotrimazole-betamethasone (LOTRISONE) cream Apply to affected area 2 times daily prn 07/13/22  Yes Espiridion Supinski K, PA-C  metroNIDAZOLE (FLAGYL) 500 MG tablet Take 1 tablet (500 mg total) by mouth 2 (two) times daily. 07/13/22  Yes Heinrich Fertig K, PA-C  miconazole (MICOTIN) 2 % cream Apply 1 Application topically 2 (two) times daily. To groin 07/13/22  Yes Zeniyah Peaster K, PA-C  norethindrone (MICRONOR,CAMILA,ERRIN) 0.35 MG tablet Take 1 tablet by mouth daily.  12/06/19  [provider]    Family History Family  History  Problem Relation Age of Onset   Diabetes Other    Healthy Mother    Diabetes Father     Social History Social History   Tobacco Use   Smoking status: Some Days    Packs/day: 0.20    Types: Cigarettes   Smokeless tobacco: Never  Vaping Use   Vaping Use: Former  Substance Use Topics   Alcohol use: Yes    Comment: socially   Drug use: No     Allergies   Patient has no known allergies.   Review of Systems Review of Systems  Constitutional:  Negative for activity change, appetite change, fatigue and fever.  Gastrointestinal:  Negative for abdominal pain, diarrhea, nausea and vomiting.  Musculoskeletal:  Negative for arthralgias and myalgias.  Skin:  Positive for rash.  Neurological:  Negative for dizziness, light-headedness and headaches.     Physical Exam Triage Vital Signs ED Triage Vitals  Enc Vitals Group     BP 07/13/22 1927 114/81     Pulse Rate 07/13/22 1927 87     Resp 07/13/22 1927 16     Temp 07/13/22 1927 99.1 F (37.3 C)     Temp Source 07/13/22 1927 Oral     SpO2 07/13/22 1927 99 %     Weight --      Height --      Head Circumference --      Peak Flow --  Pain Score 07/13/22 1928 0     Pain Loc --      Pain Edu? --      Excl. in GC? --    No data found.  Updated Vital Signs BP 114/81   Pulse 87   Temp 99.1 F (37.3 C) (Oral)   Resp 16   LMP 07/02/2022 (Exact Date)   SpO2 99%   Visual Acuity Right Eye Distance:   Left Eye Distance:   Bilateral Distance:    Right Eye Near:   Left Eye Near:    Bilateral Near:     Physical Exam Vitals reviewed.  Constitutional:      General: She is awake. She is not in acute distress.    Appearance: Normal appearance. She is well-developed. She is not ill-appearing.     Comments: Very pleasant female appears stated age in no acute distress sitting comfortably in exam room  HENT:     Head: Normocephalic and atraumatic.  Cardiovascular:     Rate and Rhythm: Normal rate and regular  rhythm.     Heart sounds: Normal heart sounds, S1 normal and S2 normal. No murmur heard. Pulmonary:     Effort: Pulmonary effort is normal.     Breath sounds: Normal breath sounds. No wheezing, rhonchi or rales.     Comments: Clear to auscultation bilaterally Abdominal:     Palpations: Abdomen is soft.     Tenderness: There is no abdominal tenderness.  Skin:    Comments: 2 cm x 1 cm annular lesion with central scaling noted left hairline.  Beefy rash with associated excoriation noted bilateral groin.  Psychiatric:        Behavior: Behavior is cooperative.      UC Treatments / Results  Labs (all labs ordered are listed, but only abnormal results are displayed) Labs Reviewed  HIV ANTIBODY (ROUTINE TESTING W REFLEX)  RPR  CBG MONITORING, ED  CERVICOVAGINAL ANCILLARY ONLY    EKG   Radiology No results found.  Procedures Procedures (including critical care time)  Medications Ordered in UC Medications - No data to display  Initial Impression / Assessment and Plan / UC Course  I have reviewed the triage vital signs and the nursing notes.  Pertinent labs & imaging results that were available during my care of the patient were reviewed by me and considered in my medical decision making (see chart for details).     Vaginal swab collected today-results pending.  Will empirically treat for bacterial vaginosis.  Patient started metronidazole.  Discussed that she should not drink any alcohol while on this medication due to Antabuse side effects.  Complete STI panel obtained today-results pending.  We will contact her if we need to arrange any additional treatment.  Discussed that she should avoid sexual activity until results are available.  Discussed the importance of safe sex practices.  We will treat annular lesion with Lotrisone.  We will treat area and groin with miconazole.  Discussed that diabetes could be contributing to symptoms.  Random blood sugar was obtained today and  was appropriate at 91.  Discussed that she should follow-up with primary care provider for ongoing management but will defer initiation of medication for the time being.  She does not currently have a PCP so we will try to establish her with someone via PCP assistance.  Discussed that she should use hypoallergenic soaps and detergents.  If she has any spread of rash or additional symptoms she needs to be seen immediately.  Strict return precautions given.  Final Clinical Impressions(s) / UC Diagnoses   Final diagnoses:  Vaginal discharge  Tinea cruris  Tinea corporis  Type 2 diabetes mellitus without complication, without long-term current use of insulin (HCC)     Discharge Instructions      We are treating you for bacterial vaginosis.  Take metronidazole.  Do not drink alcohol with this medication as it can cause vomiting.  We will contact you if needed arrange additional treatment.  Use creams as prescribed.  Use hypoallergenic soaps and detergents.  If your symptoms do not improve or if anything worsens you need to return for reevaluation.  Your blood sugar was appropriate.  Continue healthy eating and exercise.  I did put in a referral to a primary care provider.  Someone should contact you to help schedule an appointment.  It is important for follow-up.  If you develop any increased hunger, increased thirst, urinating all the time, dizziness you need to be seen immediately.     ED Prescriptions     Medication Sig Dispense Auth. Provider   clotrimazole-betamethasone (LOTRISONE) cream Apply to affected area 2 times daily prn 15 g Jenean Escandon K, PA-C   miconazole (MICOTIN) 2 % cream Apply 1 Application topically 2 (two) times daily. To groin 56 g Maximillion Gill K, PA-C   metroNIDAZOLE (FLAGYL) 500 MG tablet Take 1 tablet (500 mg total) by mouth 2 (two) times daily. 14 tablet Shawntell Dixson, Noberto Retort, PA-C      PDMP not reviewed this encounter.   Jeani Hawking, PA-C 07/13/22 2035

## 2022-07-13 NOTE — Discharge Instructions (Signed)
We are treating you for bacterial vaginosis.  Take metronidazole.  Do not drink alcohol with this medication as it can cause vomiting.  We will contact you if needed arrange additional treatment.  Use creams as prescribed.  Use hypoallergenic soaps and detergents.  If your symptoms do not improve or if anything worsens you need to return for reevaluation.  Your blood sugar was appropriate.  Continue healthy eating and exercise.  I did put in a referral to a primary care provider.  Someone should contact you to help schedule an appointment.  It is important for follow-up.  If you develop any increased hunger, increased thirst, urinating all the time, dizziness you need to be seen immediately.

## 2022-07-13 NOTE — ED Triage Notes (Signed)
C/O lesion to left temporal area after getting hair cut approx 2 wks ago; states "started as a little cut", but turned into circular, dark, pruritic patch.  C/O vaginal discharge x approx 1 wk. Denies fevers, denies abd pain.

## 2022-07-14 LAB — CERVICOVAGINAL ANCILLARY ONLY
Bacterial Vaginitis (gardnerella): POSITIVE — AB
Candida Glabrata: NEGATIVE
Candida Vaginitis: NEGATIVE
Chlamydia: NEGATIVE
Comment: NEGATIVE
Comment: NEGATIVE
Comment: NEGATIVE
Comment: NEGATIVE
Comment: NEGATIVE
Comment: NORMAL
Neisseria Gonorrhea: NEGATIVE
Trichomonas: NEGATIVE

## 2022-07-14 LAB — HIV ANTIBODY (ROUTINE TESTING W REFLEX): HIV Screen 4th Generation wRfx: NONREACTIVE

## 2022-07-14 LAB — RPR: RPR Ser Ql: NONREACTIVE

## 2022-07-15 ENCOUNTER — Encounter: Payer: Self-pay | Admitting: Emergency Medicine

## 2022-09-03 ENCOUNTER — Ambulatory Visit: Payer: BC Managed Care – PPO | Admitting: Family Medicine

## 2023-07-26 ENCOUNTER — Ambulatory Visit (HOSPITAL_COMMUNITY): Payer: BC Managed Care – PPO

## 2023-07-29 ENCOUNTER — Emergency Department (HOSPITAL_COMMUNITY)
Admission: EM | Admit: 2023-07-29 | Discharge: 2023-07-30 | Disposition: A | Discharge status: Home / Self Care | Payer: Medicaid Other | Attending: Emergency Medicine | Admitting: Emergency Medicine

## 2023-07-29 ENCOUNTER — Other Ambulatory Visit: Payer: Self-pay

## 2023-07-29 ENCOUNTER — Encounter (HOSPITAL_COMMUNITY): Payer: Self-pay | Admitting: *Deleted

## 2023-07-29 DIAGNOSIS — T7840XA Allergy, unspecified, initial encounter: Secondary | ICD-10-CM | POA: Insufficient documentation

## 2023-07-29 DIAGNOSIS — R21 Rash and other nonspecific skin eruption: Secondary | ICD-10-CM | POA: Diagnosis present

## 2023-07-29 NOTE — ED Triage Notes (Signed)
The pt had a dog that had scabies and she  has had a rash for 1 -2 weeks after she got her dog treated for the same  lmp

## 2023-07-30 MED ORDER — PREDNISONE 20 MG PO TABS
60.0000 mg | ORAL_TABLET | Freq: Once | ORAL | Status: AC
  Administered 2023-07-30: 60 mg via ORAL
  Filled 2023-07-30: qty 3

## 2023-07-30 MED ORDER — LORATADINE 10 MG PO TABS
10.0000 mg | ORAL_TABLET | Freq: Once | ORAL | Status: AC
  Administered 2023-07-30: 10 mg via ORAL
  Filled 2023-07-30: qty 1

## 2023-07-30 MED ORDER — PREDNISONE 10 MG PO TABS: 20.0000 mg | ORAL_TABLET | Freq: Every day | ORAL | 0 refills | Status: AC

## 2023-07-30 NOTE — ED Provider Notes (Signed)
[facility] Provider Note   CSN: [id number] Arrival date & time: 07/29/23  2009     History  Chief Complaint  Patient presents with   Rash    Alexandra Harris is a 32 y.o. female.  The history is provided by the patient.  Rash Quality: itchiness   Severity:  Moderate Onset quality:  Gradual Timing:  Constant Progression:  Worsening Context: animal contact   Context comment:  New dog Relieved by:  Nothing Worsened by:  Nothing Ineffective treatments:  None tried Associated symptoms: no fever, no URI, not vomiting and not wheezing   Patient is concerned for mange as dog was treated for this.      Home Medications Prior to Admission medications   Medication Sig Start Date End Date Taking? Authorizing Provider  predniSONE (DELTASONE) 10 MG tablet Take 2 tablets (20 mg total) by mouth daily. 07/30/23  Yes Evangelynn Lochridge, MD  clotrimazole-betamethasone (LOTRISONE) cream Apply to affected area 2 times daily prn Patient not taking: Reported on 07/29/2023 07/13/22   Raspet, Denny Peon K, PA-C  miconazole (MICOTIN) 2 % cream Apply 1 Application topically 2 (two) times daily. To groin Patient not taking: Reported on 07/29/2023 07/13/22   Raspet, Noberto Retort, PA-C  norethindrone (MICRONOR,CAMILA,ERRIN) 0.35 MG tablet Take 1 tablet by mouth daily.  12/06/19  [provider]      Allergies    Patient has no known allergies.    Review of Systems   Review of Systems  Constitutional:  Negative for fever.  Respiratory:  Negative for wheezing and stridor.   Cardiovascular:  Negative for chest pain.  Gastrointestinal:  Negative for vomiting.  Skin:  Positive for rash.  All other systems reviewed and are negative.   Physical Exam Updated Vital Signs BP 111/78   Pulse 77   Temp 98.2 F (36.8 C)   Resp 18   Ht 5' (1.524 m)   Wt 95.3 kg   LMP 07/28/2023   SpO2 99%   BMI 41.03 kg/m  Physical Exam Vitals reviewed.  Constitutional:       General: She is not in acute distress.    Appearance: She is well-developed.  HENT:     Head: Normocephalic and atraumatic.     Nose: Nose normal.  Eyes:     Pupils: Pupils are equal, round, and reactive to light.  Cardiovascular:     Rate and Rhythm: Normal rate and regular rhythm.     Pulses: Normal pulses.     Heart sounds: Normal heart sounds.  Pulmonary:     Effort: Pulmonary effort is normal. No respiratory distress.     Breath sounds: Normal breath sounds.  Abdominal:     General: Bowel sounds are normal. There is no distension.     Palpations: Abdomen is soft.     Tenderness: There is no abdominal tenderness. There is no guarding or rebound.  Musculoskeletal:        General: Normal range of motion.     Cervical back: Neck supple.  Skin:    General: Skin is warm and dry.     Capillary Refill: Capillary refill takes less than 2 seconds.     Findings: Rash present. No erythema.     Comments: Papular eruption of the arms and legs, spares face palms and soles.  No burrows no plaques no pustules   Neurological:     General: No focal deficit present.     Deep Tendon Reflexes:  Reflexes normal.  Psychiatric:        Mood and Affect: Mood normal.     ED Results / Procedures / Treatments   Labs (all labs ordered are listed, but only abnormal results are displayed) Labs Reviewed - No data to display  EKG None  Radiology No results found.  Procedures Procedures    Medications Ordered in ED Medications  predniSONE (DELTASONE) tablet 60 mg (has no administration in time range)  loratadine (CLARITIN) tablet 10 mg (has no administration in time range)    ED Course/ Medical Decision Making/ A&P                                 Medical Decision Making Patient with new dog   Amount and/or Complexity of Data Reviewed External Data Reviewed: notes.    Details: Previous notes reviewed   Risk Prescription drug management. Risk Details: Lesions are not consistent  with mange, there are no plaques or pustules or burrows. The lesions are consistent with allergic reaction.  I will treat for allergic reaction.  Stable for discharge     Final Clinical Impression(s) / ED Diagnoses Final diagnoses:  Allergic reaction, initial encounter   Return for intractable cough, coughing up blood, fevers > 100.4 unrelieved by medication, shortness of breath, intractable vomiting, chest pain, shortness of breath, weakness, numbness, changes in speech, facial asymmetry, abdominal pain, passing out, Inability to tolerate liquids or food, cough, altered mental status or any concerns. No signs of systemic illness or infection. The patient is nontoxic-appearing on exam and vital signs are within normal limits.  I have reviewed the triage vital signs and the nursing notes. Pertinent labs & imaging results that were available during my care of the patient were reviewed by me and considered in my medical decision making (see chart for details). After history, exam, and medical workup I feel the patient has been appropriately medically screened and is safe for discharge home. Pertinent diagnoses were discussed with the patient. Patient was given return precautions.  Rx / DC Orders ED Discharge Orders          Ordered    predniSONE (DELTASONE) 10 MG tablet  Daily        07/30/23 0017              Manas Hickling, MD 07/30/23 0021

## 2023-07-30 NOTE — Discharge Instructions (Signed)
Start daily claritin or zyrtec.

## 2024-03-08 ENCOUNTER — Ambulatory Visit (HOSPITAL_COMMUNITY)

## 2024-05-10 ENCOUNTER — Ambulatory Visit: Payer: Self-pay
